# Patient Record
Sex: Female | Born: 1950 | Race: White | Hispanic: No | Smoking: Former smoker
Health system: Southern US, Community
[De-identification: ages and names within clinical notes are randomized; demographics above are authoritative.]

## PROBLEM LIST (undated history)

## (undated) DIAGNOSIS — M8580 Other specified disorders of bone density and structure, unspecified site: Secondary | ICD-10-CM

## (undated) DIAGNOSIS — D649 Anemia, unspecified: Secondary | ICD-10-CM

## (undated) DIAGNOSIS — E7800 Pure hypercholesterolemia, unspecified: Secondary | ICD-10-CM

## (undated) DIAGNOSIS — K589 Irritable bowel syndrome without diarrhea: Secondary | ICD-10-CM

## (undated) DIAGNOSIS — K5790 Diverticulosis of intestine, part unspecified, without perforation or abscess without bleeding: Secondary | ICD-10-CM

## (undated) DIAGNOSIS — J309 Allergic rhinitis, unspecified: Secondary | ICD-10-CM

## (undated) DIAGNOSIS — Z973 Presence of spectacles and contact lenses: Secondary | ICD-10-CM

## (undated) DIAGNOSIS — E559 Vitamin D deficiency, unspecified: Secondary | ICD-10-CM

## (undated) DIAGNOSIS — M1990 Unspecified osteoarthritis, unspecified site: Secondary | ICD-10-CM

## (undated) DIAGNOSIS — Z87891 Personal history of nicotine dependence: Secondary | ICD-10-CM

## (undated) DIAGNOSIS — Z9289 Personal history of other medical treatment: Secondary | ICD-10-CM

## (undated) HISTORY — PX: TOTAL KNEE ARTHROPLASTY: SHX125

## (undated) HISTORY — DX: Other specified disorders of bone density and structure, unspecified site: M85.80

## (undated) HISTORY — PX: OSTEOTOMY: SHX137

## (undated) HISTORY — DX: Presence of spectacles and contact lenses: Z97.3

## (undated) HISTORY — DX: Irritable bowel syndrome, unspecified: K58.9

## (undated) HISTORY — PX: COLONOSCOPY: SHX174

## (undated) HISTORY — DX: Diverticulosis of intestine, part unspecified, without perforation or abscess without bleeding: K57.90

## (undated) HISTORY — DX: Allergic rhinitis, unspecified: J30.9

## (undated) HISTORY — DX: Personal history of nicotine dependence: Z87.891

## (undated) HISTORY — PX: TOTAL ABDOMINAL HYSTERECTOMY W/ BILATERAL SALPINGOOPHORECTOMY: SHX83

## (undated) HISTORY — DX: Pure hypercholesterolemia, unspecified: E78.00

## (undated) HISTORY — DX: Vitamin D deficiency, unspecified: E55.9

## (undated) HISTORY — DX: Personal history of other medical treatment: Z92.89

## (undated) HISTORY — DX: Unspecified osteoarthritis, unspecified site: M19.90

## (undated) HISTORY — DX: Anemia, unspecified: D64.9

---

## 1968-03-12 HISTORY — PX: APPENDECTOMY: SHX54

## 1998-10-07 ENCOUNTER — Inpatient Hospital Stay (HOSPITAL_COMMUNITY): Admission: RE | Admit: 1998-10-07 | Discharge: 1998-10-11 | Payer: Self-pay | Admitting: Orthopedic Surgery

## 1999-03-13 HISTORY — PX: ORIF WRIST FRACTURE: SHX2133

## 1999-05-05 ENCOUNTER — Ambulatory Visit (HOSPITAL_COMMUNITY): Admission: RE | Admit: 1999-05-05 | Discharge: 1999-05-05 | Payer: Self-pay | Admitting: Gastroenterology

## 1999-08-05 ENCOUNTER — Encounter: Payer: Self-pay | Admitting: Orthopedic Surgery

## 1999-08-05 ENCOUNTER — Inpatient Hospital Stay (HOSPITAL_COMMUNITY): Admission: EM | Admit: 1999-08-05 | Discharge: 1999-08-06 | Payer: Self-pay | Admitting: *Deleted

## 1999-10-05 ENCOUNTER — Other Ambulatory Visit: Admission: RE | Admit: 1999-10-05 | Discharge: 1999-10-05 | Payer: Self-pay | Admitting: Family Medicine

## 2002-10-21 ENCOUNTER — Encounter: Payer: Self-pay | Admitting: Orthopedic Surgery

## 2002-10-26 ENCOUNTER — Inpatient Hospital Stay (HOSPITAL_COMMUNITY): Admission: RE | Admit: 2002-10-26 | Discharge: 2002-10-29 | Payer: Self-pay | Admitting: Orthopedic Surgery

## 2004-07-19 DIAGNOSIS — Z9289 Personal history of other medical treatment: Secondary | ICD-10-CM

## 2004-07-19 HISTORY — DX: Personal history of other medical treatment: Z92.89

## 2005-04-19 ENCOUNTER — Other Ambulatory Visit: Admission: RE | Admit: 2005-04-19 | Discharge: 2005-04-19 | Payer: Self-pay | Admitting: Family Medicine

## 2005-11-10 LAB — HM COLONOSCOPY

## 2006-04-24 DIAGNOSIS — Z9289 Personal history of other medical treatment: Secondary | ICD-10-CM

## 2006-04-24 HISTORY — DX: Personal history of other medical treatment: Z92.89

## 2010-04-04 ENCOUNTER — Other Ambulatory Visit: Payer: Self-pay | Admitting: Dermatology

## 2010-04-27 ENCOUNTER — Other Ambulatory Visit: Payer: Self-pay | Admitting: Dermatology

## 2010-10-11 ENCOUNTER — Ambulatory Visit (INDEPENDENT_AMBULATORY_CARE_PROVIDER_SITE_OTHER): Payer: BC Managed Care – PPO | Admitting: Family Medicine

## 2010-10-11 ENCOUNTER — Encounter: Payer: Self-pay | Admitting: Family Medicine

## 2010-10-11 VITALS — BP 132/80 | HR 64 | Ht 66.0 in | Wt 157.0 lb

## 2010-10-11 DIAGNOSIS — N959 Unspecified menopausal and perimenopausal disorder: Secondary | ICD-10-CM

## 2010-10-11 DIAGNOSIS — M545 Low back pain: Secondary | ICD-10-CM

## 2010-10-11 DIAGNOSIS — E7800 Pure hypercholesterolemia, unspecified: Secondary | ICD-10-CM

## 2010-10-11 DIAGNOSIS — D649 Anemia, unspecified: Secondary | ICD-10-CM

## 2010-10-11 DIAGNOSIS — J329 Chronic sinusitis, unspecified: Secondary | ICD-10-CM

## 2010-10-11 MED ORDER — ESTROGENS CONJUGATED 0.3 MG PO TABS: 0.3000 mg | ORAL_TABLET | Freq: Every day | ORAL | Status: DC

## 2010-10-11 MED ORDER — TRAMADOL HCL 50 MG PO TABS: 50.0000 mg | ORAL_TABLET | Freq: Four times a day (QID) | ORAL | Status: DC | PRN

## 2010-10-11 NOTE — Patient Instructions (Signed)
  Check B12 level Monday (prior to getting next weekly injection)  Consider trial of oral B12 at some point.  Zyrtec D, Mucinex , sinus rinses to help with sinus pressure  Check your insurance coverage for Zostavax (shingles vaccine)  Try tapering down (and eventually off) of your premarin--start when the weather cools down.  Continue to get your yearly mammograms

## 2010-10-11 NOTE — Progress Notes (Signed)
Patient presents to establish care.  She brings in records from her previous physician, which are reviewed in detail with her at today's visit.  She also brings in labs drawn by nurse Okey Regal (from her job) from 7/30 showing normal c-met (except Phos 4.8), total chol 194, TG 109, HDL 63, LDL 109.  Normal TSH, CBC (Hgb 13.8), Vitamin D 32.5, A1c 5.7  She reports having been evaluated back in November for episodes of significant fatigue, heaviness in her limbs.  Was found to be anemic; found to have high EBV titers--recommended to start B12 weekly injections.  B12 was 316, but anemic.  Symptoms resolved and anemia improved with weekly B12 injections. Episodes of fatigue recurred when changed B12 injections to qoweek.  Back on weekly injections, and no problems with fatigue.  Hg is now back to normal.  Today, patient is complaining of sinus pressure.  She has had sinus pressure in left maxillary sinus x months.  Has had a few sinus infections treated with z-pak, never completely resolved, but improved.  Today awoke with severe pressure in L cheek and headache behind both eyes.  Denies runny nose, sniffling, postnasal drip, throat-clearing, cough.  Took OTC sudafed with some improvement.  Denies h/o allergies. Tried Allegra-D for a month without improvement.  Has used nasal steroids in the past, but think maybe it made things worse rather than better.  She is also requesting refill on Premarin.  Has been on hormones since her hysterectomy at age 37, but dose has been tapered.  Recently has been having some mild night sweats.  Past Medical History  Diagnosis Date  . IBS (irritable bowel syndrome)   . Pure hypercholesterolemia   . Osteopenia     DEXA '09 T-1.7 at hip ([facility])  . Allergic rhinitis, cause unspecified   . Diverticulosis   . Unspecified vitamin D deficiency   . Endometriosis     s/p TAH-BSO age 40  . DJD (degenerative joint disease)     low back, hips, knees, arthritis in thumbs  . Former  smoker     quit 2006  . Anemia     low-normal B12, resolved on B12    Past Surgical History  Procedure Date  . Total abdominal hysterectomy w/ bilateral salpingoophorectomy age 55    endometriosis  . Appendectomy 1970  . Osteotomy     RLE, Dr. Thurston Hole  . Total knee arthroplasty 2000, 2004    Dr. Thurston Hole, then Dr. Sherlean Foot  . Colonoscopy 9/07    q5 years (Dr. Madilyn Fireman)  . Orif wrist fracture 2001    right    History   Social History  . Marital Status: Significant Other    Spouse Name: N/A    Number of Children: 0  . Years of Education: N/A   Occupational History  . 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 Smoking status: Former Smoker    Quit date: 03/12/2004  . Smokeless tobacco: Not on file  . Alcohol Use: Yes     1-2 month  . Drug Use: No  . Sexually Active: Not on file   Other Topics Concern  . Not on file   Social History Narrative  . No narrative on file    Family History  Problem Relation Age of Onset  . COPD Mother   . Colon polyps Mother   . Hypertension Father   . Hyperlipidemia Father   . Heart disease Father   . Cancer Paternal Grandmother  stomach    Current outpatient prescriptions:atorvastatin (LIPITOR) 40 MG tablet, Take 40 mg by mouth daily.  , Disp: , Rfl: ;  butalbital-acetaminophen-caffeine (FIORICET, ESGIC) 50-325-40 MG per tablet, Take 1 tablet by mouth as needed.  , Disp: , Rfl: ;  Calcium-Vitamin D 600-200 MG-UNIT per tablet, Take 1 tablet by mouth daily. A , Disp: , Rfl: ;  Cyanocobalamin (VITAMIN B-12 IJ), Inject 1,000 mcg as directed once a week.  , Disp: , Rfl:  dicyclomine (BENTYL) 20 MG tablet, Take 20 mg by mouth as needed.  , Disp: , Rfl: ;  estrogens, conjugated, (PREMARIN) 0.3 MG tablet, Take 1 tablet (0.3 mg total) by mouth daily., Disp: 30 tablet, Rfl: 5;  traMADol (ULTRAM) 50 MG tablet, Take 1 tablet (50 mg total) by mouth every 6 (six) hours as needed for pain., Disp: 30 tablet, Rfl: 0  Allergies    Allergen Reactions  . Morphine And Related Other (See Comments)    hallucinations  . Penicillins Other (See Comments)    Gi upset   ROS: Denies fevers, chills, +URI symptoms (see HPI). Denies urinary complaints, joint complaints, nausea, vomiting.  + joint pains (hands, low back, hips).  Denies skin rash, depression, or other concerns.  PHYSICAL EXAM:  Well developed, well nourished patient, in no distress BP 132/80  Pulse 64  Ht 5\' 6"  (1.676 m)  Wt 157 lb (71.215 kg)  BMI 25.34 kg/m2 HEENT:  Nasal mucosa mildly edematous, L>R, clear-white drainage.  Cobblestoning posterior OP.  TM's and EAc's normal.  Tender over bilateral frontal, and L>R maxillary sinuses Neck: No lymphadenopathy or thyromegaly or mass Heart:  Regular rate and rhythm, no murmurs, rubs, gallops or ectopy Lungs:  Clear bilaterally, without wheezes, rales or ronchi Abdomen:  Soft, nontender, nondistended, no hepatosplenomegaly or masses, normal bowel sounds Extremities:  No clubbing, cyanosis or edema, 2+ pulses.  Neuro:  Alert and oriented x 3, cranial nerves grossly intact.  DTR's 2+ and symmetric.  Normal strength and sensation Back:  No spine or CVA tenderness Skin: no rashes or suspicious lesions Psych:  Normal mood, affect, hygiene and grooming, normal speech, eye contact  ASSESSMENT/PLAN: 1. Sinusitis     no evidence of bacterial infection.  Supportive measures  2. Postmenopausal symptoms  estrogens, conjugated, (PREMARIN) 0.3 MG tablet   risks/benefits of hormones discussed; recommend trial to taper off once weather cools  3. Lumbago  traMADol (ULTRAM) 50 MG tablet  4. Anemia     resolved.  on weekly B12, which seems excessive for longterm.  Rec checking B12 levels again.  Consider trial of oral replacement  5. Pure hypercholesterolemia     controlled    Discussed risks versus benefits of premarin.  Already on low dose.  Given that she is having some night sweats currently, recommended further  tapering when the weather gets cooler.  Get yearly mammograms--scheduled for later this month  Discussed risks/benefits of Zostavax. She is to check with her insurance and schedule nurse visit if desired  Check B12 level Monday (prior to getting next weekly injection) Consider trial of oral B12  Sinus pain: Zyrtec D, Mucinex , sinus rinses 1-2x/day.  Reviewed signs/symptoms of bacterial infection  F/u in 6 months, sooner prn

## 2010-11-08 ENCOUNTER — Other Ambulatory Visit: Payer: Self-pay | Admitting: Family Medicine

## 2010-11-08 DIAGNOSIS — M545 Low back pain: Secondary | ICD-10-CM

## 2010-11-08 MED ORDER — TRAMADOL HCL 50 MG PO TABS: 50.0000 mg | ORAL_TABLET | Freq: Four times a day (QID) | ORAL | Status: DC | PRN

## 2010-11-08 NOTE — Telephone Encounter (Signed)
Refill request received from [facility] on [address] for Tramadol 50mg  #30.  Last filled 10/11/10.  Okay to refill now, but appears to be using daily (#30 in 30 days).  If ongoing daily use, will need OV to further discuss pain and pain control options.

## 2010-11-20 ENCOUNTER — Telehealth: Payer: Self-pay | Admitting: *Deleted

## 2010-11-20 NOTE — Telephone Encounter (Signed)
Called patient and company nurse to notify them both that Dr.Knapp is decreasing B12 injections to q2weekly and then recheck B12l level 2 months (prior to injection).

## 2010-12-22 ENCOUNTER — Telehealth: Payer: Self-pay | Admitting: Family Medicine

## 2010-12-22 DIAGNOSIS — M545 Low back pain: Secondary | ICD-10-CM

## 2010-12-22 MED ORDER — TRAMADOL HCL 50 MG PO TABS: 50.0000 mg | ORAL_TABLET | Freq: Four times a day (QID) | ORAL | Status: DC | PRN

## 2010-12-22 NOTE — Telephone Encounter (Signed)
Tramadol renewed.

## 2010-12-28 LAB — HM COLONOSCOPY

## 2011-02-26 ENCOUNTER — Other Ambulatory Visit: Payer: Self-pay | Admitting: Family Medicine

## 2011-02-26 NOTE — Telephone Encounter (Signed)
Is this ok?

## 2011-02-26 NOTE — Telephone Encounter (Signed)
This one is yours JL

## 2011-03-30 ENCOUNTER — Telehealth: Payer: Self-pay | Admitting: Internal Medicine

## 2011-03-30 MED ORDER — TRAMADOL HCL 50 MG PO TABS: 50.0000 mg | ORAL_TABLET | Freq: Four times a day (QID) | ORAL | Status: DC | PRN

## 2011-03-30 NOTE — Telephone Encounter (Signed)
E-rxd

## 2011-04-26 ENCOUNTER — Encounter: Payer: Self-pay | Admitting: *Deleted

## 2011-04-26 ENCOUNTER — Telehealth: Payer: Self-pay | Admitting: Internal Medicine

## 2011-04-26 DIAGNOSIS — N959 Unspecified menopausal and perimenopausal disorder: Secondary | ICD-10-CM

## 2011-04-26 MED ORDER — ESTROGENS CONJUGATED 0.3 MG PO TABS: 0.3000 mg | ORAL_TABLET | Freq: Every day | ORAL | Status: DC

## 2011-04-26 NOTE — Telephone Encounter (Signed)
Advise pt refilled x 3 months.  Encourage her to continue to taper down the dose/frequency of HRT due to risks we reviewed at last visit.  Per chart, mammo is UTD 10/2010, but last CPE was 05/2009.  Please schedule CPE

## 2011-04-26 NOTE — Telephone Encounter (Signed)
Left message for patient to return my call.

## 2011-04-26 NOTE — Telephone Encounter (Signed)
Spoke with patient and she scheduled CPE wPAP for 06/21/11 @ 9:15am.

## 2011-05-02 ENCOUNTER — Telehealth: Payer: Self-pay | Admitting: Internal Medicine

## 2011-05-02 MED ORDER — TRAMADOL HCL 50 MG PO TABS: 50.0000 mg | ORAL_TABLET | Freq: Four times a day (QID) | ORAL | Status: DC | PRN

## 2011-05-02 NOTE — Telephone Encounter (Signed)
Has CPE 06/2011

## 2011-05-30 ENCOUNTER — Telehealth: Payer: Self-pay | Admitting: Internal Medicine

## 2011-05-30 MED ORDER — TRAMADOL HCL 50 MG PO TABS: 50.0000 mg | ORAL_TABLET | Freq: Four times a day (QID) | ORAL | Status: DC | PRN

## 2011-05-30 NOTE — Telephone Encounter (Signed)
done

## 2011-06-21 ENCOUNTER — Encounter: Payer: Self-pay | Admitting: Family Medicine

## 2011-06-21 ENCOUNTER — Ambulatory Visit (INDEPENDENT_AMBULATORY_CARE_PROVIDER_SITE_OTHER): Payer: BC Managed Care – PPO | Admitting: Family Medicine

## 2011-06-21 VITALS — BP 130/80 | HR 72 | Ht 65.0 in | Wt 156.0 lb

## 2011-06-21 DIAGNOSIS — E538 Deficiency of other specified B group vitamins: Secondary | ICD-10-CM

## 2011-06-21 DIAGNOSIS — Z0000 Encounter for general adult medical examination without abnormal findings: Secondary | ICD-10-CM

## 2011-06-21 DIAGNOSIS — Z23 Encounter for immunization: Secondary | ICD-10-CM

## 2011-06-21 DIAGNOSIS — Z2911 Encounter for prophylactic immunotherapy for respiratory syncytial virus (RSV): Secondary | ICD-10-CM

## 2011-06-21 DIAGNOSIS — E7800 Pure hypercholesterolemia, unspecified: Secondary | ICD-10-CM

## 2011-06-21 DIAGNOSIS — M8580 Other specified disorders of bone density and structure, unspecified site: Secondary | ICD-10-CM | POA: Insufficient documentation

## 2011-06-21 DIAGNOSIS — N959 Unspecified menopausal and perimenopausal disorder: Secondary | ICD-10-CM

## 2011-06-21 DIAGNOSIS — M899 Disorder of bone, unspecified: Secondary | ICD-10-CM

## 2011-06-21 LAB — POCT URINALYSIS DIPSTICK
Blood, UA: NEGATIVE
Ketones, UA: NEGATIVE
Protein, UA: NEGATIVE
Spec Grav, UA: <=1.005

## 2011-06-21 MED ORDER — ESTROGENS CONJUGATED 0.3 MG PO TABS: 0.3000 mg | ORAL_TABLET | Freq: Every day | ORAL | Status: DC

## 2011-06-21 NOTE — Progress Notes (Signed)
Carol Murphy is a 61 y.o. female who presents for a complete physical.  She has the following concerns:  Wants to discuss mood swings and hot flashes. Has been tapering the premarin, but having a lot more hot flashes.  Noted this when she went to every other day.  Has felt somewhat down, denies irritability.  Chronic issues with sleeping, doesn't seem to be worse, because she takes nightly sleep meds.  Has sinus pressure this time of year, uses sudafed and sinus rinses with good results. Gets insomnia if takes sudafed too late.  Her insurance covers shingles vaccine, and she would like this today, although has some questions.  Hyperlipidemia--compliant with meds. She brings in copies of labs done at work 06/15/11: c-met normal, with normal iron, uric acid. Glu 95 Total chol 162, TG 71, HDL 60, LDL 88, chol/HDL ratio 2.7 Normal thyroid studies. Normal CBC, MCV 94. A1c 5.7% Vitamin D-OH is 28.1 (on 1000 IU of Vitamin D currently in her vitamins)  Health Maintenance Immunization History  Administered Date(s) Administered  . Influenza Split 12/31/1996, 01/19/2000, 01/30/2001  . Influenza Whole 01/29/2002, 01/11/2007  . Td 11/04/1991, 10/29/2000  . Tdap 05/11/2009  . Zoster 06/21/2011   Last Pap smear: n/a Last mammogram: 10/2010 Last colonoscopy: 9/12 with Dr. Madilyn Fireman --reportedly normal (not sent results) Last DEXA: 2/09 Dentist: twice yearly Ophtho: yearly--scheduled for this month Exercise: walks daily.  No weight-bearing exercise  Past Medical History  Diagnosis Date  . IBS (irritable bowel syndrome)   . Pure hypercholesterolemia   . Osteopenia     DEXA '09 T-1.7 at hip ([facility])  . Allergic rhinitis, cause unspecified   . Diverticulosis   . Unspecified vitamin D deficiency   . Endometriosis     s/p TAH-BSO age 59  . DJD (degenerative joint disease)     low back, hips, knees, arthritis in thumbs  . Former smoker     quit 2006  . Anemia     low-normal B12, resolved on B12     Past Surgical History  Procedure Date  . Total abdominal hysterectomy w/ bilateral salpingoophorectomy age 45    endometriosis  . Appendectomy 1970  . Osteotomy     RLE, Dr. Thurston Hole  . Total knee arthroplasty 2000, 2004    Dr. Thurston Hole, then Dr. Sherlean Foot  . Colonoscopy 9/07, 11/2010    q5 years (Dr. Madilyn Fireman)  . Orif wrist fracture 2001    right    History   Social History  . Marital Status: Significant Other    Spouse Name: N/A    Number of Children: 0  . Years of Education: N/A   Occupational History  . 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 Smoking status: Former Smoker    Quit date: 03/12/2004  . Smokeless tobacco: Never Used  . Alcohol Use: Yes     1-2 month  . Drug Use: No  . Sexually Active: Not on file   Other Topics Concern  . Not on file   Social History Narrative   Lives with [name], her female partner, and her 58 year old daughter, and 2 chihuahuas.    Family History  Problem Relation Age of Onset  . COPD Mother   . Colon polyps Mother   . Hypertension Father   . Hyperlipidemia Father   . Heart disease Father   . Diabetes Father     diet controlled  . Cancer Paternal Grandmother     stomach  Current Outpatient Prescriptions on File Prior to Visit  Medication Sig Dispense Refill  . atorvastatin (LIPITOR) 40 MG tablet Take 40 mg by mouth daily.        . butalbital-acetaminophen-caffeine (FIORICET, ESGIC) 50-325-40 MG per tablet Take 1 tablet by mouth as needed.        . Calcium-Vitamin D 600-200 MG-UNIT per tablet Take 1 tablet by mouth daily. A       . Cyanocobalamin (VITAMIN B-12 IJ) Inject 1,000 mcg as directed every 14 (fourteen) days.       Marland Kitchen dicyclomine (BENTYL) 20 MG tablet Take 20 mg by mouth as needed.        . diphenhydramine-acetaminophen (TYLENOL PM) 25-500 MG TABS Take 1 tablet by mouth at bedtime as needed.      . traMADol (ULTRAM) 50 MG tablet Take 1 tablet (50 mg total) by mouth every 6 (six) hours  as needed for pain.  30 tablet  0  . DISCONTD: estrogens, conjugated, (PREMARIN) 0.3 MG tablet Take 1 tablet (0.3 mg total) by mouth daily.  30 tablet  2   Allergies  Allergen Reactions  . Morphine And Related Other (See Comments)    hallucinations  . Penicillins Other (See Comments)    Gi upset   ROS:  The patient denies anorexia, fever, weight changes, headaches,  vision changes, decreased hearing, ear pain, sore throat, breast concerns, chest pain, palpitations, dizziness, syncope, dyspnea on exertion, cough, swelling, nausea, vomiting, diarrhea, constipation, abdominal pain, melena, hematochezia, indigestion/heartburn, hematuria, dysuria, vaginal bleeding, discharge, odor or itch, genital lesions, numbness, tingling, weakness, tremor, suspicious skin lesions, anxiety, abnormal bleeding/bruising, or enlarged lymph nodes. +headaches 2-3 times/month, relieved by her headache meds. +sinus headaches Occasional stress incontinence  PHYSICAL EXAM: BP 130/80  Pulse 72  Ht 5\' 5"  (1.651 m)  Wt 156 lb (70.761 kg)  BMI 25.96 kg/m2  General Appearance:    Alert, cooperative, no distress, appears stated age  Head:    Normocephalic, without obvious abnormality, atraumatic  Eyes:    PERRL, conjunctiva/corneas clear, EOM's intact, fundi    benign  Ears:    Normal TM's and external ear canals  Nose:   Nares normal, mucosa mildly edematous, no drainage or sinus   tenderness  Throat:   Lips, mucosa, and tongue normal; teeth and gums normal  Neck:   Supple, no lymphadenopathy;  thyroid:  no   enlargement/tenderness/nodules; no carotid   bruit or JVD  Back:    Spine nontender, no curvature, ROM normal, no CVA  tenderness.  Tenderness at L SI joint.  No muscle spasm.  Lungs:     Clear to auscultation bilaterally without wheezes, rales or     ronchi; respirations unlabored  Chest Wall:    No tenderness or deformity   Heart:    Regular rate and rhythm, S1 and S2 normal, no murmur, rub   or gallop   Breast Exam:    No tenderness, masses, or nipple discharge or inversion.      No axillary lymphadenopathy  Abdomen:     Soft, non-tender, nondistended, normoactive bowel sounds,    no masses, no hepatosplenomegaly  Genitalia:    Normal external genitalia without lesions.  BUS and vagina normal; normal bimanual exam without masses.  Pap not performed.  Rectal:    Normal tone, no masses or tenderness; no stool in vault for heme-testing.  Extremities:   No clubbing, cyanosis or edema  Pulses:   2+ and symmetric all extremities  Skin:  Skin color, texture, turgor normal, no rashes or lesions  Lymph nodes:   Cervical, supraclavicular, and axillary nodes normal  Neurologic:   CNII-XII intact, normal strength, sensation and gait; reflexes 2+ and symmetric throughout          Psych:   Normal mood, affect, hygiene and grooming.     ASSESSMENT/PLAN: 1. Routine general medical examination at a health care facility  POCT Urinalysis Dipstick, Visual acuity screening  2. Postmenopausal symptoms  estrogens, conjugated, (PREMARIN) 0.3 MG tablet  3. Pure hypercholesterolemia     well controlled on Lipitor  4. Need for shingles vaccine  Varicella-zoster vaccine subcutaneous  5. Osteopenia     due for DEXA (at [facility])  6. B12 deficiency     getting shots every 2 weeks.  due for repeat level (gets labs drawn at work)    Vitamin D deficiency--increase to 2000 IU daily Menopausal symptoms--risks vs benefits of HRT reviewed.  Given lack of family history of breast cancer, and that she is not tolerating further taper down, and is on a low dose, elects to continue the 0.3 mg of premarin.  Continue to get yearly mammograms, self breast exams.  If there should be an abnormality on mammogram, understands this will need to be stopped until it is fully evaluated, and may need to consider other medications (ie Tamoxifen) to treat symptoms in that scenario.  Hyperlipidemia--well controlled. Gets lipitor free from her  job. Well controlled. B12 deficiency--cut back from weekly shots to every other week in August 2012 after levels were >1000.  Hasn't had level checked since.  Recommend checking it next week prior to getting next B12 injection (will get it done at work)  Discussed monthly self breast exams and yearly mammograms after the age of 30; at least 30 minutes of aerobic activity at least 5 days/week; proper sunscreen use reviewed; healthy diet, including goals of calcium and vitamin D intake and alcohol recommendations (less than or equal to 1 drink/day) reviewed; regular seatbelt use; changing batteries in smoke detectors.  Immunization recommendations discussed--zostavax given today.  Colonoscopy recommendations reviewed--UTD.  zostavax--risks and side effects reviewed.  Osteopenia: Weight-bearing exercise encouraged.  Rx given to patient as order for DEXA at [facility].  She will let us know if she has trouble scheduling it this way

## 2011-06-21 NOTE — Patient Instructions (Addendum)
HEALTH MAINTENANCE RECOMMENDATIONS:  It is recommended that you get at least 30 minutes of aerobic exercise at least 5 days/week (for weight loss, you may need as much as 60-90 minutes). This can be any activity that gets your heart rate up. This can be divided in 10-15 minute intervals if needed, but try and build up your endurance at least once a week.  Weight bearing exercise is also recommended twice weekly.  Eat a healthy diet with lots of vegetables, fruits and fiber.  "Colorful" foods have a lot of vitamins (ie green vegetables, tomatoes, red peppers, etc).  Limit sweet tea, regular sodas and alcoholic beverages, all of which has a lot of calories and sugar.  Up to 1 alcoholic drink daily may be beneficial for women (unless trying to lose weight, watch sugars).  Drink a lot of water.  Calcium recommendations are 1200-1500 mg daily (1500 mg for postmenopausal women or women without ovaries), and vitamin D 1000 IU daily.  This should be obtained from diet and/or supplements (vitamins), and calcium should not be taken all at once, but in divided doses.  Monthly self breast exams and yearly mammograms for women over the age of 15 is recommended.  Sunscreen of at least SPF 30 should be used on all sun-exposed parts of the skin when outside between the hours of 10 am and 4 pm (not just when at beach or pool, but even with exercise, golf, tennis, and yard work!)  Use a sunscreen that says "broad spectrum" so it covers both UVA and UVB rays, and make sure to reapply every 1-2 hours.  Remember to change the batteries in your smoke detectors when changing your clock times in the spring and fall.  Use your seat belt every time you are in a car, and please drive safely and not be distracted with cell phones and texting while driving.   Try some claritin or zyrtec to see if that helps with your seasonal sinus pressure. Continue sinus rinses, and sudafed as needed.  Add weight-bearing exercise to your  routine once or twice weekly.

## 2011-06-28 ENCOUNTER — Telehealth: Payer: Self-pay | Admitting: Internal Medicine

## 2011-06-28 MED ORDER — TRAMADOL HCL 50 MG PO TABS: 50.0000 mg | ORAL_TABLET | Freq: Four times a day (QID) | ORAL | Status: DC | PRN

## 2011-06-28 NOTE — Telephone Encounter (Signed)
done

## 2011-07-02 ENCOUNTER — Encounter: Payer: Self-pay | Admitting: Family Medicine

## 2011-09-03 ENCOUNTER — Telehealth: Payer: Self-pay | Admitting: Internal Medicine

## 2011-09-03 MED ORDER — TRAMADOL HCL 50 MG PO TABS: 50.0000 mg | ORAL_TABLET | Freq: Four times a day (QID) | ORAL | Status: DC | PRN

## 2011-09-03 NOTE — Telephone Encounter (Signed)
Refill history reviewed--filled 2/20, 3/20. 4/18, 5/20.  It appears that she is taking this daily.  Previously reported taking only "every other day or so".  Recommend OV to discuss other possible treatments for back pain (or whatever pain she is treating with it).  Okay for #30

## 2011-09-03 NOTE — Telephone Encounter (Signed)
Spoke with patient and informed her that Dr.Knapp would like to her schedule OV to discuss tx options. Called in #30 and patient said that she would have to call back to set up appt as she had to check her schedule.

## 2011-12-12 ENCOUNTER — Telehealth: Payer: Self-pay | Admitting: Internal Medicine

## 2011-12-12 DIAGNOSIS — N959 Unspecified menopausal and perimenopausal disorder: Secondary | ICD-10-CM

## 2011-12-12 MED ORDER — ESTROGENS CONJUGATED 0.3 MG PO TABS: 0.3000 mg | ORAL_TABLET | Freq: Every day | ORAL | Status: DC

## 2011-12-12 NOTE — Telephone Encounter (Signed)
Spoke with patient and let her know that refill was called in until April, when she is due for CPE. Mammo done 11/04/11 @ [facility]-report in chart and signed off on.

## 2011-12-12 NOTE — Telephone Encounter (Signed)
Need to see if she had mammo (was due in August)--no scanned records or imaging results that I can see in computer. If she has had mammogram, then can refill until April (when CPE due)

## 2012-04-04 ENCOUNTER — Encounter: Payer: Self-pay | Admitting: Internal Medicine

## 2012-04-04 DIAGNOSIS — Z9289 Personal history of other medical treatment: Secondary | ICD-10-CM

## 2012-04-04 HISTORY — DX: Personal history of other medical treatment: Z92.89

## 2012-06-18 ENCOUNTER — Encounter: Payer: Self-pay | Admitting: Medical

## 2012-06-18 ENCOUNTER — Ambulatory Visit (INDEPENDENT_AMBULATORY_CARE_PROVIDER_SITE_OTHER): Payer: PRIVATE HEALTH INSURANCE | Admitting: Medical

## 2012-06-18 ENCOUNTER — Telehealth: Payer: Self-pay | Admitting: Family Medicine

## 2012-06-18 VITALS — BP 100/80 | HR 74 | Temp 98.2°F | Resp 16 | Ht 64.5 in | Wt 149.0 lb

## 2012-06-18 DIAGNOSIS — Z0000 Encounter for general adult medical examination without abnormal findings: Secondary | ICD-10-CM

## 2012-06-18 DIAGNOSIS — Z780 Asymptomatic menopausal state: Secondary | ICD-10-CM

## 2012-06-18 DIAGNOSIS — R0989 Other specified symptoms and signs involving the circulatory and respiratory systems: Secondary | ICD-10-CM

## 2012-06-18 DIAGNOSIS — M949 Disorder of cartilage, unspecified: Secondary | ICD-10-CM

## 2012-06-18 DIAGNOSIS — J309 Allergic rhinitis, unspecified: Secondary | ICD-10-CM

## 2012-06-18 DIAGNOSIS — E538 Deficiency of other specified B group vitamins: Secondary | ICD-10-CM

## 2012-06-18 DIAGNOSIS — N959 Unspecified menopausal and perimenopausal disorder: Secondary | ICD-10-CM

## 2012-06-18 DIAGNOSIS — E7800 Pure hypercholesterolemia, unspecified: Secondary | ICD-10-CM

## 2012-06-18 DIAGNOSIS — M8580 Other specified disorders of bone density and structure, unspecified site: Secondary | ICD-10-CM

## 2012-06-18 LAB — POCT URINALYSIS DIPSTICK
Bilirubin, UA: NEGATIVE
Glucose, UA: NEGATIVE
Leukocytes, UA: NEGATIVE
Nitrite, UA: NEGATIVE
Urobilinogen, UA: NEGATIVE
pH, UA: 7

## 2012-06-18 MED ORDER — ESTROGENS CONJUGATED 0.3 MG PO TABS: 0.3000 mg | ORAL_TABLET | Freq: Every day | ORAL | Status: DC

## 2012-06-18 MED ORDER — FLUTICASONE PROPIONATE 50 MCG/ACT NA SUSP: 2.0000 | Freq: Every day | NASAL | Status: DC

## 2012-06-18 NOTE — Telephone Encounter (Signed)
Message copied by Beverly Suriano L on Wed Jun 18, 2012  3:11 PM ------      Message from: Aleen Campi, DAVID S      Created: Wed Jun 18, 2012  9:54 AM       pls set up ABIs with [facility] due to decreased LE pulses and mild claudication symptoms, but I want to speak to office manager first. ------

## 2012-06-18 NOTE — Progress Notes (Signed)
Subjective:   HPI  Carol Murphy is a 62 y.o. female who presents for a complete physical.  Doing well, no c/o.   just had bone density screening and mammogram done, labs through work.   Has copies of all of this today.   Preventative care: Last ophthalmology visit:yes-unsure of the name Last dental visit:yes- Dr. Valetta Fuller Last colonoscopy:2013- Dr. Madilyn Fireman Last mammogram:04/04/2012 Last gynecological exam:don't have these Last ZOX:WRUEAV  Last labs:06/12/12  Prior vaccinations: TD or Tdap:05/11/09 Influenza:01/2012 Pneumococcal:never Shingles/Zostavax:06/2011  Advanced directive:yes Health care power of attorney:yes Living will:yes  Concerns: Chronic sinus pressure, uses Claritin and sudafed daily.  No typical allergy symptoms, just ongoing sinus pressure.  Reviewed their medical, surgical, family, social, medication, and allergy history and updated chart as appropriate.   Past Medical History  Diagnosis Date  . IBS (irritable bowel syndrome)   . Pure hypercholesterolemia   . Osteopenia     DEXA '09 T-1.7 at hip ([facility])  . Allergic rhinitis, cause unspecified   . Diverticulosis   . Unspecified vitamin D deficiency   . Endometriosis     s/p TAH-BSO age 80  . DJD (degenerative joint disease)     low back, hips, knees, arthritis in thumbs  . Former smoker     quit 2006  . Anemia     low-normal B12, resolved on B12  . History of cardiovascular stress test 07/19/04    age 6yo, normal cardiolite study, Dr. Jacinto Halim  . H/O echocardiogram 04/24/2006    normal study, EF>55%, mild tricuspid regurgitation; Dr. Jacinto Halim  . H/O bone density study 04/04/12, 2009    low bone mass, improved from 2009 study, consider repeat 3 years  . H/O mammogram 04/04/12    normal  . Wears glasses     Past Surgical History  Procedure Laterality Date  . Total abdominal hysterectomy w/ bilateral salpingoophorectomy  age 63    endometriosis  . Appendectomy  1970  . Osteotomy      RLE, Dr. Thurston Hole   . Total knee arthroplasty  2000, 2004    Dr. Thurston Hole, then Dr. Sherlean Foot  . Colonoscopy  9/07, 11/2011    q5 years (Dr. Madilyn Fireman)  . Orif wrist fracture  2001    right    Family History  Problem Relation Age of Onset  . COPD Mother   . Colon polyps Mother   . Hypertension Father   . Hyperlipidemia Father   . Heart disease Father 50    died of MI  . Diabetes Father     diet controlled  . Cancer Paternal Grandmother     stomach  . Heart disease Paternal Aunt   . Heart disease Paternal Uncle   . Stroke Neg Hx     History   Social History  . Marital Status: Significant Other    Spouse Name: N/A    Number of Children: 0  . Years of Education: N/A   Occupational History  . 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 Smoking status: Former Smoker -- 1.00 packs/day for 25 years    Quit date: 03/12/2004  . Smokeless tobacco: Never Used  . Alcohol Use: Yes     Comment: 1-2 month  . Drug Use: No  . Sexually Active: Not on file   Other Topics Concern  . Not on file   Social History Narrative   Lives with [name], her female partner, and her 39 year old daughter, and 2 chihuahuas.  Exercise -  walks most days;  Works at [employer].    Current Outpatient Prescriptions on File Prior to Visit  Medication Sig Dispense Refill  . atorvastatin (LIPITOR) 40 MG tablet Take 40 mg by mouth daily.        . butalbital-acetaminophen-caffeine (FIORICET, ESGIC) 50-325-40 MG per tablet Take 1 tablet by mouth as needed.        . Calcium-Vitamin D 600-200 MG-UNIT per tablet Take 1 tablet by mouth daily. A       . ciprofloxacin (CIPRO) 500 MG tablet Take 500 mg by mouth as needed.      . Cyanocobalamin (VITAMIN B-12 IJ) Inject 1,000 mcg as directed every 21 ( twenty-one) days.       Marland Kitchen dicyclomine (BENTYL) 20 MG tablet Take 20 mg by mouth as needed.       . diphenhydramine-acetaminophen (TYLENOL PM) 25-500 MG TABS Take 1 tablet by mouth at bedtime as needed.       . pseudoephedrine (SUDAFED) 30 MG tablet Take 30 mg by mouth every 4 (four) hours as needed.      . traMADol (ULTRAM) 50 MG tablet Take 1 tablet (50 mg total) by mouth every 6 (six) hours as needed for pain.  30 tablet  0   No current facility-administered medications on file prior to visit.    Allergies  Allergen Reactions  . Morphine And Related Other (See Comments)    hallucinations  . Penicillins Other (See Comments)    Gi upset    Review of Systems Constitutional: -fever, -chills, -sweats, -unexpected weight change, -decreased appetite, -fatigue Allergy: -sneezing, -itching, -congestion Dermatology: -changing moles, --rash, -lumps ENT: -runny nose, -ear pain, -sore throat, -hoarseness, +sinus pain, -teeth pain, - ringing in ears, -hearing loss, -nosebleeds Cardiology: -chest pain, -palpitations, -swelling, -difficulty breathing when lying flat, -waking up short of breath Respiratory: -cough, -shortness of breath, -difficulty breathing with exercise or exertion, -wheezing, -coughing up blood Gastroenterology: -abdominal pain, -nausea, -vomiting, -diarrhea, -constipation, -blood in stool, -changes in bowel movement, -difficulty swallowing or eating Hematology: -bleeding, -bruising  Musculoskeletal: +joint aches, -muscle aches, -joint swelling, +back pain, -neck pain, -cramping, -changes in gait Ophthalmology: denies vision changes, eye redness, itching, discharge Urology: -burning with urination, -difficulty urinating, -blood in urine, -urinary frequency, -urgency, -incontinence Neurology: +headache, -weakness, -tingling, -numbness, -memory loss, -falls, -dizziness Psychology: -depressed mood, -agitation, +sleep problems     Objective:   Physical Exam  Vitals and nurse notes reviewed   General appearance: alert, no distress, WD/WN, white female Skin: scattered benign appearing lesions, no worrisome lesions HEENT: normocephalic, conjunctiva/corneas normal, sclerae anicteric,  PERRLA, EOMi, nares patent, no discharge or erythema, pharynx normal Oral cavity: MMM, tongue normal, teeth in good repair Neck: supple, no lymphadenopathy, no thyromegaly, no masses, normal ROM, no bruits Chest: non tender, normal shape and expansion Heart: RRR, normal S1, S2, no murmurs Lungs: CTA bilaterally, no wheezes, rhonchi, or rales Abdomen: +bs, soft, non tender, non distended, no masses, no hepatomegaly, no splenomegaly, no bruits Back: non tender, normal ROM, no scoliosis Musculoskeletal: upper extremities non tender, no obvious deformity, normal ROM throughout, lower extremities non tender, no obvious deformity, normal ROM throughout Extremities: no edema, no cyanosis, no clubbing Pulses: 1+ pedal pulses, somewhat difficult to palpate, otherwise 2+ symmetric, upper extremity pulses, normal cap refill Neurological: alert, oriented x 3, CN2-12 intact, strength normal upper extremities and lower extremities, sensation normal throughout, DTRs 2+ throughout, no cerebellar signs, gait normal Psychiatric: normal affect, behavior normal, pleasant  Breast: nontender, no masses or  lumps, no skin changes, no nipple discharge or inversion, no axillary lymphadenopathy Gyn: introitus and labial atrophic changes noted,mild, othewrise normal external genitalia without lesions, vagina with normal mucosa, s/p hysterectomy, no abnormal vaginal discharge, nontender, no masses.  Pap not performed.  Exam chaperoned by nurse. Rectal:deferred     Assessment and Plan :    Encounter Diagnoses  Name Primary?  . Routine general medical examination at a health care facility Yes  . Decreased pulses in feet   . Allergic rhinitis   . Postmenopausal symptoms   . Pure hypercholesterolemia   . Postmenopausal   . Vitamin B12 deficiency   . Osteopenia     Physical exam - discussed healthy lifestyle, diet, exercise, preventative care, vaccinations, and addressed their concerns.  Handout given.  reviewed  recent studies, labs.  Decreased pulses, mild claudication symptoms - will set up ABIs for screening  Allergic rhinitis - stop Claritin, limit sudafed, begin Zyrtec 10mg  QHS and fluticasone nasal, recheck 53mo  Postmenopausal - discussed risks/benefits of premarin.   She is aware of risks, but is quite symptomatic with hot flashes and insomnia without, wants to c/t Premarin low dose.   Refills given  Hypercholesterolemia - reviewed recent labs, c/t same medication  B12 deficiency - c/t B12 shots q3 wk  Osteopenia - reviewed bone density study - c/t aerobic exercise, add weight bearing exercise weekly, Ca 1500mg  with 1000-2000 IU daily Vit D, limit alcohol  Follow-up 53mo on allergies/sinus pressure

## 2012-06-18 NOTE — Telephone Encounter (Signed)
I fax everything over to [facility] for her referral to have ABI'S done and they will contact the patient to schedule her appointment. CLS

## 2012-06-18 NOTE — Patient Instructions (Signed)
Recommendations: Continue 1500mg  calcium daily + 1000-2000 IU Vit D daily  Allergies/sinuses - Begin Zyrtec 10mg  at bedtime instead of Claritin Try backing off the Sudafed Begin Flonase nasal spray, 2 sprays per nostril daily (sniff sniff)  Get regular aerobic and weight bearing exercise  We will set you up for ABI blood pressure studies in the leg to rule out peripheral vascular disease  Continue you other medications as usual.

## 2012-06-19 ENCOUNTER — Other Ambulatory Visit (HOSPITAL_COMMUNITY): Payer: Self-pay | Admitting: Medical

## 2012-06-19 ENCOUNTER — Encounter: Payer: Self-pay | Admitting: Medical

## 2012-06-19 DIAGNOSIS — R0989 Other specified symptoms and signs involving the circulatory and respiratory systems: Secondary | ICD-10-CM

## 2012-06-19 DIAGNOSIS — I70219 Atherosclerosis of native arteries of extremities with intermittent claudication, unspecified extremity: Secondary | ICD-10-CM

## 2012-06-24 ENCOUNTER — Ambulatory Visit (HOSPITAL_COMMUNITY)
Admission: RE | Admit: 2012-06-24 | Discharge: 2012-06-24 | Disposition: A | Discharge status: Home / Self Care | Payer: PRIVATE HEALTH INSURANCE | Source: Ambulatory Visit | Attending: Cardiovascular Disease | Admitting: Cardiovascular Disease

## 2012-06-24 DIAGNOSIS — I70219 Atherosclerosis of native arteries of extremities with intermittent claudication, unspecified extremity: Secondary | ICD-10-CM | POA: Insufficient documentation

## 2012-06-24 DIAGNOSIS — R0989 Other specified symptoms and signs involving the circulatory and respiratory systems: Secondary | ICD-10-CM | POA: Insufficient documentation

## 2012-06-24 NOTE — Progress Notes (Signed)
ABIs were completed. Larene Pickett RVT

## 2012-07-15 ENCOUNTER — Ambulatory Visit: Payer: PRIVATE HEALTH INSURANCE | Admitting: Medical

## 2012-09-17 ENCOUNTER — Ambulatory Visit: Payer: PRIVATE HEALTH INSURANCE | Admitting: Medical

## 2012-12-29 ENCOUNTER — Encounter: Payer: Self-pay | Admitting: Medical

## 2012-12-29 ENCOUNTER — Ambulatory Visit (INDEPENDENT_AMBULATORY_CARE_PROVIDER_SITE_OTHER): Payer: PRIVATE HEALTH INSURANCE | Admitting: Medical

## 2012-12-29 ENCOUNTER — Telehealth: Payer: Self-pay | Admitting: Family Medicine

## 2012-12-29 VITALS — BP 130/88 | HR 80 | Temp 98.1°F | Resp 16 | Wt 149.0 lb

## 2012-12-29 DIAGNOSIS — R300 Dysuria: Secondary | ICD-10-CM

## 2012-12-29 DIAGNOSIS — K5732 Diverticulitis of large intestine without perforation or abscess without bleeding: Secondary | ICD-10-CM

## 2012-12-29 DIAGNOSIS — R1032 Left lower quadrant pain: Secondary | ICD-10-CM

## 2012-12-29 DIAGNOSIS — R319 Hematuria, unspecified: Secondary | ICD-10-CM

## 2012-12-29 DIAGNOSIS — K5792 Diverticulitis of intestine, part unspecified, without perforation or abscess without bleeding: Secondary | ICD-10-CM

## 2012-12-29 LAB — CBC WITH DIFFERENTIAL/PLATELET
Basophils Absolute: 0 10*3/uL (ref 0.0–0.1)
Basophils Relative: 0 % (ref 0–1)
Eosinophils Absolute: 0.1 10*3/uL (ref 0.0–0.7)
Eosinophils Relative: 0 % (ref 0–5)
Lymphs Abs: 1.5 10*3/uL (ref 0.7–4.0)
MCH: 31.7 pg (ref 26.0–34.0)
MCHC: 34.9 g/dL (ref 30.0–36.0)
MCV: 90.6 fL (ref 78.0–100.0)
Neutrophils Relative %: 84 % — ABNORMAL HIGH (ref 43–77)
Platelets: 247 10*3/uL (ref 150–400)
RBC: 4.8 MIL/uL (ref 3.87–5.11)
RDW: 12.7 % (ref 11.5–15.5)

## 2012-12-29 LAB — POCT URINALYSIS DIPSTICK
Glucose, UA: NEGATIVE
Nitrite, UA: NEGATIVE
Spec Grav, UA: 1.01
Urobilinogen, UA: NEGATIVE

## 2012-12-29 MED ORDER — HYDROCODONE-ACETAMINOPHEN 5-325 MG PO TABS: 1.0000 | ORAL_TABLET | Freq: Four times a day (QID) | ORAL | Status: DC | PRN

## 2012-12-29 MED ORDER — METRONIDAZOLE 500 MG PO TABS: 500.0000 mg | ORAL_TABLET | Freq: Three times a day (TID) | ORAL | Status: DC

## 2012-12-29 MED ORDER — CIPROFLOXACIN HCL 750 MG PO TABS: 750.0000 mg | ORAL_TABLET | Freq: Two times a day (BID) | ORAL | Status: DC

## 2012-12-29 NOTE — Telephone Encounter (Signed)
Message copied by Janeice Robinson on Mon Dec 29, 2012  3:55 PM ------      Message from: Jac Canavan      Created: Mon Dec 29, 2012  1:34 PM       pls get copy of 2013 colonoscopy, Dr. Madilyn Fireman ------

## 2012-12-29 NOTE — Progress Notes (Signed)
Subjective: Here for severe left abdominal pain, diarrhea, started last night.  Been up most of night with pain and diarrhea, x 6 loose stools.  Reports a history of diverticulosis on colonoscopy, diverticulitis mild flareup about 9 months ago, was treated with Cipro and Flagyl and did okay on this.  She thinks this is currently what is going on.  Currently she still has left lower quadrant abdominal pain, left back pain, and now more midline pelvic pain and pressure.  She reports urinary urgency.  She has seen little blood in her stool, and saw this last time she had diverticulitis.   Last colonoscopy last year 2013.  She denies fever, nausea, vomiting, vaginal discharge or vaginal complaint, no history of UTI or kidney stone.    Past Medical History  Diagnosis Date  . IBS (irritable bowel syndrome)   . Pure hypercholesterolemia   . Osteopenia     DEXA '09 T-1.7 at hip ([facility])  . Allergic rhinitis, cause unspecified   . Diverticulosis   . Unspecified vitamin D deficiency   . Endometriosis     s/p TAH-BSO age 43  . DJD (degenerative joint disease)     low back, hips, knees, arthritis in thumbs  . Former smoker     quit 2006  . Anemia     low-normal B12, resolved on B12  . History of cardiovascular stress test 07/19/04    age 82yo, normal cardiolite study, Dr. Jacinto Halim  . H/O echocardiogram 04/24/2006    normal study, EF>55%, mild tricuspid regurgitation; Dr. Jacinto Halim  . H/O bone density study 04/04/12, 2009    low bone mass, improved from 2009 study, consider repeat 3 years  . H/O mammogram 04/04/12    normal  . Wears glasses    Past Surgical History  Procedure Laterality Date  . Total abdominal hysterectomy w/ bilateral salpingoophorectomy  age 66    endometriosis  . Appendectomy  1970  . Osteotomy      RLE, Dr. Thurston Hole  . Total knee arthroplasty  2000, 2004    Dr. Thurston Hole, then Dr. Sherlean Foot  . Colonoscopy  9/07, 11/2011    q5 years (Dr. Madilyn Fireman)  . Orif wrist fracture  2001    right    ROS  as in subjective  Objective: Filed Vitals:   12/29/12 1153  BP: 130/88  Pulse: 80  Temp: 98.1 F (36.7 C)  Resp: 16    General appearance: alert, no distress, WD/WN Heart: RRR, normal S1, S2, no murmurs Lungs: CTA bilaterally, no wheezes, rhonchi, or rales Abdomen: +decreased bs, soft, quite tender left lower quadrant, tenderness suprapubic, mild left sided generalized tenderness , non distended, no masses, no hepatomegaly, no splenomegaly Back: mild CVA tenderness left Pulses: 2+ symmetric  Assessment: Encounter Diagnoses  Name Primary?  . Abdominal pain, left lower quadrant Yes  . Diverticulitis   . Blood in urine   . Dysuria     Plan: Discussed her symptoms and exam findings. Her urinalysis shows moderate blood and trace protein.   We will check some labs as send the urine for culture. We will empirically treat for diverticulitis with Flagyl and Cipro. Advise clear liquids only intake, bowel rest, and as things improve can add back bland B.R.A.T. Diet.  Discussed the diagnosis and answered her questions. Handout given. We will also try and get a copy of her 2013 colonoscopy. Follow-up pending labs and f/u in 1wk, sooner prn.

## 2012-12-29 NOTE — Telephone Encounter (Signed)
Dr. Madilyn Fireman office will fax over her 2012 colonoscopy report. CLS

## 2012-12-29 NOTE — Patient Instructions (Signed)
Diverticulitis °A diverticulum is a small pouch or sac on the colon. Diverticulosis is the presence of these diverticula on the colon. Diverticulitis is the irritation (inflammation) or infection of diverticula. °CAUSES  °The colon and its diverticula contain bacteria. If food particles block the tiny opening to a diverticulum, the bacteria inside can grow and cause an increase in pressure. This leads to infection and inflammation and is called diverticulitis. °SYMPTOMS  °· Abdominal pain and tenderness. Usually, the pain is located on the left side of your abdomen. However, it could be located elsewhere. °· Fever. °· Bloating. °· Feeling sick to your stomach (nausea). °· Throwing up (vomiting). °· Abnormal stools. °DIAGNOSIS  °Your caregiver will take a history and perform a physical exam. Since many things can cause abdominal pain, other tests may be necessary. Tests may include: °· Blood tests. °· Urine tests. °· X-ray of the abdomen. °· CT scan of the abdomen. °Sometimes, surgery is needed to determine if diverticulitis or other conditions are causing your symptoms. °TREATMENT  °Most of the time, you can be treated without surgery. Treatment includes: °· Resting the bowels by only having liquids for a few days. As you improve, you will need to eat a low-fiber diet. °· Intravenous (IV) fluids if you are losing body fluids (dehydrated). °· Antibiotic medicines that treat infections may be given. °· Pain and nausea medicine, if needed. °· Surgery if the inflamed diverticulum has burst. °HOME CARE INSTRUCTIONS  °· Try a clear liquid diet (broth, tea, or water for as long as directed by your caregiver). You may then gradually begin a low-fiber diet as tolerated.  °A low-fiber diet is a diet with less than 10 grams of fiber. Choose the foods below to reduce fiber in the diet: °· White breads, cereals, rice, and pasta. °· Cooked fruits and vegetables or soft fresh fruits and vegetables without the skin. °· Ground or  well-cooked tender beef, ham, veal, lamb, pork, or poultry. °· Eggs and seafood. °· After your diverticulitis symptoms have improved, your caregiver may put you on a high-fiber diet. A high-fiber diet includes 14 grams of fiber for every 1000 calories consumed. For a standard 2000 calorie diet, you would need 28 grams of fiber. Follow these diet guidelines to help you increase the fiber in your diet. It is important to slowly increase the amount fiber in your diet to avoid gas, constipation, and bloating. °· Choose whole-grain breads, cereals, pasta, and brown rice. °· Choose fresh fruits and vegetables with the skin on. Do not overcook vegetables because the more vegetables are cooked, the more fiber is lost. °· Choose more nuts, seeds, legumes, dried peas, beans, and lentils. °· Look for food products that have greater than 3 grams of fiber per serving on the Nutrition Facts label. °· Take all medicine as directed by your caregiver. °· If your caregiver has given you a follow-up appointment, it is very important that you go. Not going could result in lasting (chronic) or permanent injury, pain, and disability. If there is any problem keeping the appointment, call to reschedule. °SEEK MEDICAL CARE IF:  °· Your pain does not improve. °· You have a hard time advancing your diet beyond clear liquids. °· Your bowel movements do not return to normal. °SEEK IMMEDIATE MEDICAL CARE IF:  °· Your pain becomes worse. °· You have an oral temperature above 102° F (38.9° C), not controlled by medicine. °· You have repeated vomiting. °· You have bloody or black, tarry stools. °·   Symptoms that brought you to your caregiver become worse or are not getting better. °MAKE SURE YOU:  °· Understand these instructions. °· Will watch your condition. °· Will get help right away if you are not doing well or get worse. °Document Released: 12/06/2004 Document Revised: 05/21/2011 Document Reviewed: 04/03/2010 °[employer]® Patient Information  ©2014 [employer]. ° °

## 2012-12-30 ENCOUNTER — Encounter: Payer: Self-pay | Admitting: Internal Medicine

## 2012-12-30 LAB — COMPREHENSIVE METABOLIC PANEL
Albumin: 4.2 g/dL (ref 3.5–5.2)
Alkaline Phosphatase: 46 U/L (ref 39–117)
CO2: 25 mEq/L (ref 19–32)
Calcium: 8.8 mg/dL (ref 8.4–10.5)
Creat: 0.74 mg/dL (ref 0.50–1.10)
Sodium: 136 mEq/L (ref 135–145)
Total Bilirubin: 0.4 mg/dL (ref 0.3–1.2)

## 2012-12-31 LAB — URINE CULTURE: Colony Count: 40000

## 2013-01-05 ENCOUNTER — Other Ambulatory Visit: Payer: Self-pay | Admitting: Medical

## 2013-01-05 MED ORDER — NYSTATIN 100000 UNIT/ML MT SUSP: 500000.0000 [IU] | Freq: Four times a day (QID) | OROMUCOSAL | Status: DC

## 2013-06-26 ENCOUNTER — Other Ambulatory Visit: Payer: Self-pay | Admitting: Medical

## 2013-06-26 NOTE — Telephone Encounter (Signed)
Message was left for pt to call office. Per shane pt. Needs cpe.

## 2013-06-26 NOTE — Telephone Encounter (Signed)
Needs physical visit.  Last physical/discussion of premarin 06/2012.

## 2013-06-26 NOTE — Telephone Encounter (Signed)
Is this okay to refill? 

## 2013-07-02 ENCOUNTER — Other Ambulatory Visit: Payer: Self-pay | Admitting: Medical

## 2013-07-02 ENCOUNTER — Telehealth: Payer: Self-pay | Admitting: Medical

## 2013-07-02 DIAGNOSIS — N959 Unspecified menopausal and perimenopausal disorder: Secondary | ICD-10-CM

## 2013-07-02 MED ORDER — ESTROGENS CONJUGATED 0.3 MG PO TABS: 0.3000 mg | ORAL_TABLET | Freq: Every day | ORAL | Status: DC

## 2013-07-02 NOTE — Telephone Encounter (Signed)
Pt scheduled appt for 07/23/13 with you, she is out of her Premarin & needs RF to hold her til appt

## 2013-07-23 ENCOUNTER — Encounter: Payer: Self-pay | Admitting: Medical

## 2013-07-23 ENCOUNTER — Ambulatory Visit (INDEPENDENT_AMBULATORY_CARE_PROVIDER_SITE_OTHER): Payer: PRIVATE HEALTH INSURANCE | Admitting: Medical

## 2013-07-23 VITALS — BP 120/78 | HR 76 | Temp 98.2°F | Resp 18 | Ht 64.5 in | Wt 147.0 lb

## 2013-07-23 DIAGNOSIS — Z0000 Encounter for general adult medical examination without abnormal findings: Secondary | ICD-10-CM

## 2013-07-23 DIAGNOSIS — N959 Unspecified menopausal and perimenopausal disorder: Secondary | ICD-10-CM

## 2013-07-23 DIAGNOSIS — R51 Headache: Secondary | ICD-10-CM

## 2013-07-23 DIAGNOSIS — K589 Irritable bowel syndrome without diarrhea: Secondary | ICD-10-CM

## 2013-07-23 DIAGNOSIS — E7800 Pure hypercholesterolemia, unspecified: Secondary | ICD-10-CM

## 2013-07-23 DIAGNOSIS — D6489 Other specified anemias: Secondary | ICD-10-CM

## 2013-07-23 LAB — POCT URINALYSIS DIPSTICK
Bilirubin, UA: NEGATIVE
Blood, UA: NEGATIVE
GLUCOSE UA: NEGATIVE
KETONES UA: NEGATIVE
Leukocytes, UA: NEGATIVE
Nitrite, UA: NEGATIVE
Protein, UA: NEGATIVE
Spec Grav, UA: <=1.005
Urobilinogen, UA: NEGATIVE
pH, UA: 5

## 2013-07-23 NOTE — Progress Notes (Signed)
Subjective:   HPI  Carol Murphy is a 63 y.o. female who presents for a complete physical.  Preventative care: Last ophthalmology visit:2014 [facility] Last dental visit:2015  Last colonoscopy:1-2 years ago Dr. Madilyn FiremanHayes Last mammogram:2014 Last gynecological exam:yrs ago Last EKG:yrs ago Last labs:2015  Prior vaccinations: TD or Tdap:unsure Influenza:2014 Pneumococcal:no Shingles/Zostavax:2014  Advanced directive:no Health care power of attorney:no Living will:no  Concerns: Over the past year has intermittent headaches, sometimes when she gets up in the middle of the night to urinate will have a headache. No double vision no vision change no numbness tingling or weakness, no fevers or night sweats or weight loss. Headaches resolve hours later    Reviewed their medical, surgical, family, social, medication, and allergy history and updated chart as appropriate.  Past Medical History  Diagnosis Date  . IBS (irritable bowel syndrome)   . Pure hypercholesterolemia   . Osteopenia     DEXA '09 T-1.7 at hip ([facility])  . Allergic rhinitis, cause unspecified   . Diverticulosis   . Unspecified vitamin D deficiency   . Endometriosis     s/p TAH-BSO age 63  . DJD (degenerative joint disease)     low back, hips, knees, arthritis in thumbs  . Former smoker     quit 2006  . Anemia     low-normal B12, resolved on B12  . History of cardiovascular stress test 07/19/04    age 63yo, normal cardiolite study, Dr. Jacinto HalimGanji  . H/O echocardiogram 04/24/2006    normal study, EF>55%, mild tricuspid regurgitation; Dr. Jacinto HalimGanji  . H/O bone density study 04/04/12, 2009    low bone mass, improved from 2009 study, consider repeat 3 years  . H/O mammogram 04/04/12    normal  . Wears glasses     Past Surgical History  Procedure Laterality Date  . Total abdominal hysterectomy w/ bilateral salpingoophorectomy  age 63    endometriosis  . Appendectomy  1970  . Osteotomy      [facility], Dr. Thurston HoleWainer  . Total  knee arthroplasty  2000, 2004    Dr. Thurston HoleWainer, then Dr. Sherlean FootLucey  . Colonoscopy  9/07, 11/2011    q5 years (Dr. Madilyn FiremanHayes)  . Orif wrist fracture  2001    right    History   Social History  . Marital Status: Significant Other    Spouse Name: N/A    Number of Children: 0  . Years of Education: N/A   Occupational History  . 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 Smoking status: Former Smoker -- 1.00 packs/day for 25 years    Quit date: 03/12/2004  . Smokeless tobacco: Never Used  . Alcohol Use: Yes     Comment: 1-2 month  . Drug Use: No  . Sexual Activity: Not on file   Other Topics Concern  . Not on file   Social History Narrative   Lives with Milas HockMary Corbin, her female partner, and her 65 year old daughter, and 2 chihuahuas.  Exercise - walks most days;  Works at [employer].    Family History  Problem Relation Age of Onset  . COPD Mother   . Colon polyps Mother   . Hypertension Father   . Hyperlipidemia Father   . Heart disease Father 3952    died of MI  . Diabetes Father     diet controlled  . Cancer Paternal Grandmother     stomach  . Heart disease Paternal Aunt   . Heart  disease Paternal Uncle   . Stroke Neg Hx     Current outpatient prescriptions:atorvastatin (LIPITOR) 40 MG tablet, Take 40 mg by mouth daily.  , Disp: , Rfl: ;  butalbital-acetaminophen-caffeine (FIORICET, ESGIC) 50-325-40 MG per tablet, Take 1 tablet by mouth as needed.  , Disp: , Rfl: ;  Calcium-Vitamin D 600-200 MG-UNIT per tablet, Take 1 tablet by mouth daily. A , Disp: , Rfl:  ciprofloxacin (CIPRO) 750 MG tablet, Take 1 tablet (750 mg total) by mouth 2 (two) times daily., Disp: 20 tablet, Rfl: 0;  Cyanocobalamin (VITAMIN B-12 IJ), Inject 1,000 mcg as directed every 21 ( twenty-one) days. , Disp: , Rfl: ;  dicyclomine (BENTYL) 20 MG tablet, Take 20 mg by mouth as needed. , Disp: , Rfl: ;  estrogens, conjugated, (PREMARIN) 0.3 MG tablet, Take 1 tablet (0.3 mg total) by  mouth daily., Disp: 30 tablet, Rfl: 0 pseudoephedrine (SUDAFED) 30 MG tablet, Take 30 mg by mouth every 4 (four) hours as needed., Disp: , Rfl: ;  diphenhydramine-acetaminophen (TYLENOL PM) 25-500 MG TABS, Take 1 tablet by mouth at bedtime as needed., Disp: , Rfl: ;  fluticasone (FLONASE) 50 MCG/ACT nasal spray, Place 2 sprays into the nose daily., Disp: 16 g, Rfl: 5 HYDROcodone-acetaminophen (NORCO/VICODIN) 5-325 MG per tablet, Take 1 tablet by mouth every 6 (six) hours as needed for pain., Disp: 20 tablet, Rfl: 0;  loratadine (CLARITIN) 10 MG tablet, Take 10 mg by mouth daily., Disp: , Rfl: ;  metroNIDAZOLE (FLAGYL) 500 MG tablet, Take 1 tablet (500 mg total) by mouth 3 (three) times daily., Disp: 30 tablet, Rfl: 0 nystatin (MYCOSTATIN) 100000 UNIT/ML suspension, Take 5 mLs (500,000 Units total) by mouth 4 (four) times daily., Disp: 60 mL, Rfl: 0;  traMADol (ULTRAM) 50 MG tablet, Take 1 tablet (50 mg total) by mouth every 6 (six) hours as needed for pain., Disp: 30 tablet, Rfl: 0  Allergies  Allergen Reactions  . Morphine And Related Other (See Comments)    hallucinations  . Penicillins Other (See Comments)    Gi upset        Review of Systems Constitutional: -fever, -chills, -sweats, -unexpected weight change, -decreased appetite, -fatigue Allergy: -sneezing, -itching, -congestion Dermatology: -changing moles, --rash, -lumps ENT: -runny nose, -ear pain, -sore throat, -hoarseness, +sinus pain, -teeth pain, - ringing in ears, -hearing loss, -nosebleeds Cardiology: -chest pain, -palpitations, -swelling, -difficulty breathing when lying flat, -waking up short of breath Respiratory: -cough, -shortness of breath, -difficulty breathing with exercise or exertion, -wheezing, -coughing up blood Gastroenterology: -abdominal pain, -nausea, -vomiting, -diarrhea, -constipation, -blood in stool, -changes in bowel movement, -difficulty swallowing or eating Hematology: -bleeding, -bruising   Musculoskeletal: - +joint aches, +muscle aches, -joint swelling, -back pain, -neck pain, -cramping, -changes in gait Ophthalmology: denies vision changes, eye redness, itching, discharge Urology: -burning with urination, -difficulty urinating, -blood in urine, -urinary frequency, -urgency, -incontinence Neurology: -headache, -weakness, -tingling, -numbness, -memory loss, -falls, -dizziness Psychology: -depressed mood, -agitation, +sleep problems     Objective:   Physical Exam  BP 120/78  Pulse 76  Temp(Src) 98.2 F (36.8 C) (Oral)  Resp 18  Ht 5' 4.5" (1.638 m)  Wt 147 lb (66.679 kg)  BMI 24.85 kg/m2  General appearance: alert, no distress, WD/WN, white female  Skin: scattered benign appearing lesions, no worrisome lesions  HEENT: normocephalic, conjunctiva/corneas normal, sclerae anicteric, PERRLA, EOMi, nares patent, no discharge or erythema, pharynx normal  Oral cavity: MMM, tongue normal, teeth in good repair  Neck: supple, no lymphadenopathy, no thyromegaly, no  masses, normal ROM, no bruits  Chest: non tender, normal shape and expansion  Heart: RRR, normal S1, S2, no murmurs  Lungs: CTA bilaterally, no wheezes, rhonchi, or rales  Abdomen: +bs, soft, non tender, non distended, no masses, no hepatomegaly, no splenomegaly, no bruits  Back: non tender, normal ROM, no scoliosis  Musculoskeletal: upper extremities non tender, no obvious deformity, normal ROM throughout, lower extremities non tender, no obvious deformity, normal ROM throughout  Extremities: no edema, no cyanosis, no clubbing  Pulses: 1+ pedal pulses, somewhat difficult to palpate, otherwise 2+ symmetric, upper extremity pulses, normal cap refill  Neurological: alert, oriented x 3, CN2-12 intact, strength normal upper extremities and lower extremities, sensation normal throughout, DTRs 2+ throughout, no cerebellar signs, gait normal  Psychiatric: normal affect, behavior normal, pleasant  Breast/gyn-review last years  exam findings, exam deferred today Rectal:deferred     Assessment and Plan :    Encounter Diagnosis  Name Primary?  . Routine general medical examination at a health care facility Yes      Physical exam - discussed healthy lifestyle, diet, exercise, preventative care, vaccinations, and addressed their concerns.  Handout given. Continue medications age usual, I reviewed her recent labs which are all fine Refilled medications Advise she keep a headache diary, try to avoid triggers, begin daily bedtime antihistamine, and if headaches are really no different within the next month we should consider treatment or scan of brain She is aware of the risk and benefits of Premarin hormone therapy, continue as she is done well on this Follow-up within a month regarding headaches

## 2013-07-24 ENCOUNTER — Encounter: Payer: Self-pay | Admitting: Medical

## 2013-07-24 MED ORDER — DICYCLOMINE HCL 20 MG PO TABS: 20.0000 mg | ORAL_TABLET | Freq: Three times a day (TID) | ORAL | Status: DC

## 2013-07-24 MED ORDER — ATORVASTATIN CALCIUM 40 MG PO TABS: 40.0000 mg | ORAL_TABLET | Freq: Every day | ORAL | Status: DC

## 2013-07-24 MED ORDER — FLUTICASONE PROPIONATE 50 MCG/ACT NA SUSP: 2.0000 | Freq: Every day | NASAL | Status: DC

## 2013-07-24 MED ORDER — LORATADINE 10 MG PO TABS: 10.0000 mg | ORAL_TABLET | Freq: Every day | ORAL | Status: DC

## 2013-07-24 MED ORDER — ESTROGENS CONJUGATED 0.3 MG PO TABS: 0.3000 mg | ORAL_TABLET | Freq: Every day | ORAL | Status: DC

## 2013-08-07 ENCOUNTER — Encounter: Payer: Self-pay | Admitting: Medical

## 2013-11-04 ENCOUNTER — Encounter: Payer: Self-pay | Admitting: Medical

## 2013-11-04 ENCOUNTER — Ambulatory Visit (INDEPENDENT_AMBULATORY_CARE_PROVIDER_SITE_OTHER): Payer: PRIVATE HEALTH INSURANCE | Admitting: Medical

## 2013-11-04 VITALS — BP 130/80 | HR 62 | Temp 97.8°F | Resp 14 | Wt 148.0 lb

## 2013-11-04 DIAGNOSIS — R51 Headache: Secondary | ICD-10-CM

## 2013-11-04 DIAGNOSIS — R519 Headache, unspecified: Secondary | ICD-10-CM

## 2013-11-04 MED ORDER — METHYLPREDNISOLONE (PAK) 4 MG PO TABS: ORAL_TABLET | ORAL | Status: DC

## 2013-11-04 MED ORDER — MOXIFLOXACIN HCL 400 MG PO TABS: 400.0000 mg | ORAL_TABLET | Freq: Every day | ORAL | Status: DC

## 2013-11-04 NOTE — Progress Notes (Signed)
Subjective:    Carol Murphy is a 63 y.o. female who presents for evaluation of headaches.   She notes headache for the past 3-4 years, more frequent of late.  Thinks it could be allergy related, but the last 2 headaches involved left ear and discomfort in left neck and arm.  Ear felt clogged like if underwater.   Headache awoke her from sleep this morning 3:30am.  Took tylenol and this helped.   Getting headaches every other week.  Usually headaches behind eyes, mostly left.  headache is usually constant, sometimes in back of head and neck.   Headaches can be any time, day or night, sporadic.   rarely has to lie down for headache.  Hasn't missed work due to headache.   Denies associated hearing or vision changes. No numbness, tingling, weakness.  Sometimes gets nausea with the headaches.   At times does feel dizzy.  No recent allergy symptoms other than sinus pressure, but no sneezing,runny nose, itchy eyes or nose.  No cough, no chest congestion.   No vomiting.  No incontinence.   Usually uses Excedrin, and if this doesn't work, uses Fiorinal.  The patient rates the pain a 8 on a scale from 1 to 10.  No nosebleeds. Seeing eye doctor in November for routine f/u.  No aura.  Not using daily analgesics. Not skipping meals . Denies any major stress. No sleep concerns no history of sleep apnea and snoring no witnessed apnea. No other aggravating or relieving factors.. No other complaint.  The following portions of the patient's history were reviewed and updated as appropriate: allergies, current medications, past family history, past medical history, past social history, past surgical history and problem list.  Review of Systems Constitutional: -fever, -chills, -sweats, -unexpected weight change Cardiology:  -chest pain, -palpitations, -edema, - hx/o pulsating artery in temple Respiratory: -cough, -shortness of breath, -wheezing Gastroenterology: -abdominal pain, +nausea, -vomiting, -diarrhea, -constipation   Hematology: -bleeding, some increased bruising of forearms with age. Musculoskeletal: - no stiff neck, -arthralgias, -myalgias, -joint swelling, -back pain Ophthalmology: -vision changes, - vision loss, -double vision Urology: -dysuria, -difficulty urinating, -hematuria, -urinary frequency, -urgency Neurology: -weakness, -tingling, -numbness, -speech changes, -urinary or bowel incontinence, -LOC, -altered mental status.   Other: No recent head injury or concussion, no recent fasting from meals, no recent change in caffeine use, headaches are not worsened by exertion Psych: -no recent change in stress level   Past Medical History  Diagnosis Date  . IBS (irritable bowel syndrome)   . Pure hypercholesterolemia   . Osteopenia     DEXA '09 T-1.7 at hip ([facility])  . Allergic rhinitis, cause unspecified   . Diverticulosis   . Unspecified vitamin D deficiency   . Endometriosis     s/p TAH-BSO age 75  . DJD (degenerative joint disease)     low back, hips, knees, arthritis in thumbs  . Former smoker     quit 2006  . Anemia     low-normal B12, resolved on B12  . History of cardiovascular stress test 07/19/04    age 78yo, normal cardiolite study, Dr. Jacinto Halim  . H/O echocardiogram 04/24/2006    normal study, EF>55%, mild tricuspid regurgitation; Dr. Jacinto Halim  . H/O bone density study 04/04/12, 2009    low bone mass, improved from 2009 study, consider repeat 3 years  . H/O mammogram 04/04/12    normal  . Wears glasses    Family History  Problem Relation Age of Onset  . COPD Mother   .  Colon polyps Mother   . Hypertension Father   . Hyperlipidemia Father   . Heart disease Father 35    died of MI  . Diabetes Father     diet controlled  . Cancer Paternal Grandmother     stomach  . Heart disease Paternal Aunt   . Heart disease Paternal Uncle   . Stroke Neg Hx       Objective:   Physical Exam  Filed Vitals:   11/04/13 0819  BP: 130/80  Pulse: 62  Temp: 97.8 F (36.6 C)  Resp: 14    Wt Readings from Last 3 Encounters:  11/04/13 148 lb (67.132 kg)  07/23/13 147 lb (66.679 kg)  12/29/12 149 lb (67.586 kg)   BP Readings from Last 3 Encounters:  11/04/13 130/80  07/23/13 120/78  12/29/12 130/88    General appearance: alert, no distress, WD/WN HEENT: normocephalic, sclerae anicteric, PERRLA, EOMi, left TM flat, nares patent, no discharge or erythema, pharynx normal Oral cavity: MMM, no lesions Neck: supple, no lymphadenopathy, no thyromegaly, no masses, no bruits Heart: RRR, normal S1, S2, no murmurs Lungs: CTA bilaterally, no wheezes, rhonchi, or rales Musculoskeletal: nontender, no swelling, no obvious deformity Extremities: unremarkable Pulses: 2+ symmetric, upper and lower extremities Neurological: alert, oriented x 3, CN2-12 intact, strength normal upper extremities and lower extremities, sensation normal throughout, DTRs 2+ throughout, no cerebellar signs, gait normal Psychiatric: normal affect, behavior normal, pleasant     Assessment:   Encounter Diagnosis  Name Primary?  . Chronic nonintractable headache, unspecified headache type Yes    Plan:   We discussed her chronic headaches, discussed possible triggers that are common, discussed a wide differential.  At this point she has no clear picture other than possible chronic sinusitis and allergies.  She is already on daily Flonase and Claritin.  We will use a course of Medrol dose pack and Avelox for chronic sinusitis.   If no improvement at all and consider brain MRI.   However, if she gets improvement but relapses we'll refer to ENT.  She will call back within 2 weeks to update me on symptoms.  I reviewed her labs she had done at work and the only significant finding is hemoglobin A1c is 6.0%.  Rest of labs including CMET, lipids, thyroid, CBC normal

## 2013-11-06 ENCOUNTER — Telehealth: Payer: Self-pay | Admitting: Medical

## 2013-11-06 NOTE — Telephone Encounter (Signed)
I called the pharmacy and spoke to the pharmacist.  If she knows who told her the Avelox was a sulfa derivative then she needs to call back the pharmacy and advised them who told her this info as it is not true.  It really wouldn't matter since she doesn't have a sulfa allergy listed.  The chance of interaction with the Avelox and steroid is low however if she wants to be extra careful, then have her do the Avelox first, let's see how she does, then if still needed we'll do a round of the steroid

## 2013-11-06 NOTE — Telephone Encounter (Signed)
Patients partner who is listed on her HIPPA form is aware of the message and she she agrees to start with the Avelox and then if needed she will take the steriod. CLS

## 2013-11-06 NOTE — Telephone Encounter (Signed)
Pt called and stated that when she went to pick up her meds a advisory was given to her. On the medication advisory it stated Avelox should not be just with prednisone which She was also prescribed. The pharmacist also advised pt that Avelox is a derivative of sulfur drugs. She stated that she has adverse reactions to sulfur drugs. Pt would like to speak to you concerning these issues with medication. Pt [facility] [address]. She can be reached at [phone].

## 2013-11-09 ENCOUNTER — Encounter: Payer: Self-pay | Admitting: Medical

## 2013-11-20 ENCOUNTER — Telehealth: Payer: Self-pay | Admitting: Medical

## 2013-11-20 NOTE — Telephone Encounter (Signed)
Will refer to ENT on Monday. cls

## 2013-11-20 NOTE — Telephone Encounter (Signed)
I had mentioned if no improvement at all then consider brain MRI. However, if she gets improvement but relapses we'll refer to ENT.  So did she any improvement or has there been no change at all?

## 2013-11-20 NOTE — Telephone Encounter (Signed)
Lmom to cb. CLS 

## 2013-11-20 NOTE — Telephone Encounter (Signed)
Patient would like a referral to ENT first before having a MRI due to the cost. Is ENT, can't help then she will go for the MRI. CLS

## 2013-11-23 NOTE — Telephone Encounter (Signed)
Patient is aware of her appointment to see Dr. Suszanne Conners on 11/24/13 @ 400 pm [phone]. CLS

## 2013-11-25 ENCOUNTER — Telehealth: Payer: Self-pay | Admitting: Family Medicine

## 2013-11-25 NOTE — Telephone Encounter (Signed)
DR. Suszanne Conners [address] [place], [place]  [phone] 11/24/13 @ 400 PM

## 2013-11-30 ENCOUNTER — Other Ambulatory Visit (INDEPENDENT_AMBULATORY_CARE_PROVIDER_SITE_OTHER): Payer: Self-pay | Admitting: Otolaryngology

## 2013-11-30 DIAGNOSIS — J320 Chronic maxillary sinusitis: Secondary | ICD-10-CM

## 2013-12-04 ENCOUNTER — Ambulatory Visit
Admission: RE | Admit: 2013-12-04 | Discharge: 2013-12-04 | Disposition: A | Discharge status: Home / Self Care | Payer: PRIVATE HEALTH INSURANCE | Source: Ambulatory Visit | Attending: Otolaryngology | Admitting: Otolaryngology

## 2013-12-04 DIAGNOSIS — J320 Chronic maxillary sinusitis: Secondary | ICD-10-CM

## 2014-10-07 DIAGNOSIS — M25519 Pain in unspecified shoulder: Secondary | ICD-10-CM | POA: Insufficient documentation

## 2014-10-11 ENCOUNTER — Other Ambulatory Visit: Payer: Self-pay | Admitting: Orthopedic Surgery

## 2014-10-11 DIAGNOSIS — M25512 Pain in left shoulder: Secondary | ICD-10-CM

## 2014-10-17 ENCOUNTER — Other Ambulatory Visit: Payer: PRIVATE HEALTH INSURANCE

## 2014-11-05 ENCOUNTER — Ambulatory Visit
Admission: RE | Admit: 2014-11-05 | Discharge: 2014-11-05 | Disposition: A | Discharge status: Home / Self Care | Payer: PRIVATE HEALTH INSURANCE | Source: Ambulatory Visit | Attending: Orthopedic Surgery | Admitting: Orthopedic Surgery

## 2014-11-05 DIAGNOSIS — M25512 Pain in left shoulder: Secondary | ICD-10-CM

## 2014-11-09 DIAGNOSIS — Z96651 Presence of right artificial knee joint: Secondary | ICD-10-CM | POA: Insufficient documentation

## 2014-11-30 DIAGNOSIS — Z9889 Other specified postprocedural states: Secondary | ICD-10-CM | POA: Insufficient documentation

## 2015-05-03 ENCOUNTER — Other Ambulatory Visit: Payer: Self-pay | Admitting: Family Medicine

## 2015-05-03 ENCOUNTER — Telehealth: Payer: Self-pay | Admitting: Medical

## 2015-05-03 NOTE — Telephone Encounter (Signed)
Pt is aware and physical appt is this friday

## 2015-05-03 NOTE — Telephone Encounter (Signed)
Mammogram normal/no worrisome findings.  Set her up for physical appt.

## 2015-05-04 LAB — CMP12+LP+TP+TSH+6AC+CBC/D/PLT
ALBUMIN: 4.2 g/dL (ref 3.6–4.8)
ALK PHOS: 55 IU/L (ref 39–117)
ALT: 15 IU/L (ref 0–32)
AST: 19 IU/L (ref 0–40)
Albumin/Globulin Ratio: 1.8 (ref 1.1–2.5)
BASOS: 0 %
BUN / CREAT RATIO: 14 (ref 11–26)
BUN: 11 mg/dL (ref 8–27)
Basophils Absolute: 0 10*3/uL (ref 0.0–0.2)
Bilirubin Total: 0.3 mg/dL (ref 0.0–1.2)
CALCIUM: 9.4 mg/dL (ref 8.7–10.3)
CHLORIDE: 100 mmol/L (ref 96–106)
CHOLESTEROL TOTAL: 175 mg/dL (ref 100–199)
Chol/HDL Ratio: 2.5 ratio units (ref 0.0–4.4)
Creatinine, Ser: 0.78 mg/dL (ref 0.57–1.00)
EOS (ABSOLUTE): 0.1 10*3/uL (ref 0.0–0.4)
EOS: 3 %
Estimated CHD Risk: <0.5 times avg. (ref 0.0–1.0)
FREE THYROXINE INDEX: 1.5 (ref 1.2–4.9)
GFR calc Af Amer: 92 mL/min/{1.73_m2} (ref >59)
GFR calc non Af Amer: 79 mL/min/{1.73_m2} (ref >59)
GGT: 21 IU/L (ref 0–60)
Globulin, Total: 2.3 g/dL (ref 1.5–4.5)
Glucose: 91 mg/dL (ref 65–99)
HDL: 71 mg/dL (ref >39)
HEMATOCRIT: 43.5 % (ref 34.0–46.6)
HEMOGLOBIN: 14.2 g/dL (ref 11.1–15.9)
IMMATURE GRANULOCYTES: 0 %
Immature Grans (Abs): 0 10*3/uL (ref 0.0–0.1)
Iron: 128 ug/dL (ref 27–139)
LDH: 176 IU/L (ref 119–226)
LDL CALC: 83 mg/dL (ref 0–99)
Lymphocytes Absolute: 1.4 10*3/uL (ref 0.7–3.1)
Lymphs: 32 %
MCH: 30.5 pg (ref 26.6–33.0)
MCHC: 32.6 g/dL (ref 31.5–35.7)
MCV: 94 fL (ref 79–97)
MONOCYTES: 8 %
Monocytes Absolute: 0.4 10*3/uL (ref 0.1–0.9)
NEUTROS PCT: 57 %
Neutrophils Absolute: 2.5 10*3/uL (ref 1.4–7.0)
PHOSPHORUS: 4.2 mg/dL (ref 2.5–4.5)
Platelets: 234 10*3/uL (ref 150–379)
Potassium: 4.1 mmol/L (ref 3.5–5.2)
RBC: 4.65 x10E6/uL (ref 3.77–5.28)
RDW: 13.2 % (ref 12.3–15.4)
SODIUM: 142 mmol/L (ref 134–144)
T3 Uptake Ratio: 26 % (ref 24–39)
T4, Total: 5.9 ug/dL (ref 4.5–12.0)
TOTAL PROTEIN: 6.5 g/dL (ref 6.0–8.5)
TSH: 1.01 u[IU]/mL (ref 0.450–4.500)
Triglycerides: 104 mg/dL (ref 0–149)
Uric Acid: 4.1 mg/dL (ref 2.5–7.1)
VLDL CHOLESTEROL CAL: 21 mg/dL (ref 5–40)
WBC: 4.5 10*3/uL (ref 3.4–10.8)

## 2015-05-04 LAB — HGB A1C W/O EAG: Hgb A1c MFr Bld: 5.7 % — ABNORMAL HIGH (ref 4.8–5.6)

## 2015-05-05 ENCOUNTER — Encounter: Payer: Self-pay | Admitting: Family Medicine

## 2015-05-06 ENCOUNTER — Ambulatory Visit (INDEPENDENT_AMBULATORY_CARE_PROVIDER_SITE_OTHER): Payer: PRIVATE HEALTH INSURANCE | Admitting: Medical

## 2015-05-06 ENCOUNTER — Encounter: Payer: Self-pay | Admitting: Medical

## 2015-05-06 VITALS — BP 148/88 | HR 74 | Wt 155.0 lb

## 2015-05-06 DIAGNOSIS — F329 Major depressive disorder, single episode, unspecified: Secondary | ICD-10-CM

## 2015-05-06 DIAGNOSIS — Z0000 Encounter for general adult medical examination without abnormal findings: Secondary | ICD-10-CM

## 2015-05-06 DIAGNOSIS — J309 Allergic rhinitis, unspecified: Secondary | ICD-10-CM

## 2015-05-06 DIAGNOSIS — R030 Elevated blood-pressure reading, without diagnosis of hypertension: Secondary | ICD-10-CM | POA: Diagnosis not present

## 2015-05-06 DIAGNOSIS — F938 Other childhood emotional disorders: Secondary | ICD-10-CM | POA: Diagnosis not present

## 2015-05-06 DIAGNOSIS — M1990 Unspecified osteoarthritis, unspecified site: Secondary | ICD-10-CM

## 2015-05-06 DIAGNOSIS — K219 Gastro-esophageal reflux disease without esophagitis: Secondary | ICD-10-CM

## 2015-05-06 DIAGNOSIS — E7800 Pure hypercholesterolemia, unspecified: Secondary | ICD-10-CM

## 2015-05-06 DIAGNOSIS — K589 Irritable bowel syndrome without diarrhea: Secondary | ICD-10-CM | POA: Diagnosis not present

## 2015-05-06 DIAGNOSIS — F32A Depression, unspecified: Secondary | ICD-10-CM

## 2015-05-06 DIAGNOSIS — M8580 Other specified disorders of bone density and structure, unspecified site: Secondary | ICD-10-CM

## 2015-05-06 DIAGNOSIS — Z23 Encounter for immunization: Secondary | ICD-10-CM

## 2015-05-06 DIAGNOSIS — Z7189 Other specified counseling: Secondary | ICD-10-CM

## 2015-05-06 DIAGNOSIS — Z129 Encounter for screening for malignant neoplasm, site unspecified: Secondary | ICD-10-CM | POA: Diagnosis not present

## 2015-05-06 DIAGNOSIS — Z639 Problem related to primary support group, unspecified: Secondary | ICD-10-CM

## 2015-05-06 DIAGNOSIS — Z7185 Encounter for immunization safety counseling: Secondary | ICD-10-CM

## 2015-05-06 LAB — POCT URINALYSIS DIPSTICK
BILIRUBIN UA: NEGATIVE
Blood, UA: NEGATIVE
GLUCOSE UA: NEGATIVE
Ketones, UA: NEGATIVE
NITRITE UA: NEGATIVE
Protein, UA: NEGATIVE
Spec Grav, UA: 1.015
Urobilinogen, UA: NEGATIVE
pH, UA: 6.5

## 2015-05-06 MED ORDER — BUPROPION HCL ER (SR) 150 MG PO TB12: 150.0000 mg | ORAL_TABLET | Freq: Two times a day (BID) | ORAL | Status: DC

## 2015-05-06 MED ORDER — CYANOCOBALAMIN 1000 MCG/ML IJ SOLN: 1000.0000 ug | INTRAMUSCULAR | Status: DC

## 2015-05-06 MED ORDER — OMEPRAZOLE 20 MG PO CPDR: 20.0000 mg | DELAYED_RELEASE_CAPSULE | Freq: Every day | ORAL | Status: DC

## 2015-05-06 MED ORDER — IBUPROFEN 800 MG PO TABS: 800.0000 mg | ORAL_TABLET | Freq: Three times a day (TID) | ORAL | Status: DC | PRN

## 2015-05-06 MED ORDER — ATORVASTATIN CALCIUM 40 MG PO TABS: 40.0000 mg | ORAL_TABLET | Freq: Every day | ORAL | Status: DC

## 2015-05-09 ENCOUNTER — Telehealth: Payer: Self-pay | Admitting: Medical

## 2015-05-09 ENCOUNTER — Telehealth: Payer: Self-pay | Admitting: Family Medicine

## 2015-05-09 DIAGNOSIS — M19012 Primary osteoarthritis, left shoulder: Secondary | ICD-10-CM | POA: Insufficient documentation

## 2015-05-09 DIAGNOSIS — Z639 Problem related to primary support group, unspecified: Secondary | ICD-10-CM | POA: Insufficient documentation

## 2015-05-09 DIAGNOSIS — F32A Depression, unspecified: Secondary | ICD-10-CM | POA: Insufficient documentation

## 2015-05-09 DIAGNOSIS — J309 Allergic rhinitis, unspecified: Secondary | ICD-10-CM | POA: Insufficient documentation

## 2015-05-09 DIAGNOSIS — Z129 Encounter for screening for malignant neoplasm, site unspecified: Secondary | ICD-10-CM | POA: Insufficient documentation

## 2015-05-09 DIAGNOSIS — K219 Gastro-esophageal reflux disease without esophagitis: Secondary | ICD-10-CM | POA: Insufficient documentation

## 2015-05-09 DIAGNOSIS — Z23 Encounter for immunization: Secondary | ICD-10-CM | POA: Insufficient documentation

## 2015-05-09 DIAGNOSIS — E7800 Pure hypercholesterolemia, unspecified: Secondary | ICD-10-CM | POA: Insufficient documentation

## 2015-05-09 DIAGNOSIS — Z1239 Encounter for other screening for malignant neoplasm of breast: Secondary | ICD-10-CM | POA: Insufficient documentation

## 2015-05-09 DIAGNOSIS — Z7185 Encounter for immunization safety counseling: Secondary | ICD-10-CM | POA: Insufficient documentation

## 2015-05-09 DIAGNOSIS — Z7189 Other specified counseling: Secondary | ICD-10-CM | POA: Insufficient documentation

## 2015-05-09 DIAGNOSIS — K589 Irritable bowel syndrome without diarrhea: Secondary | ICD-10-CM | POA: Insufficient documentation

## 2015-05-09 DIAGNOSIS — R030 Elevated blood-pressure reading, without diagnosis of hypertension: Secondary | ICD-10-CM | POA: Insufficient documentation

## 2015-05-09 DIAGNOSIS — F329 Major depressive disorder, single episode, unspecified: Secondary | ICD-10-CM | POA: Insufficient documentation

## 2015-05-09 MED ORDER — LEVOCETIRIZINE DIHYDROCHLORIDE 5 MG PO TABS: 5.0000 mg | ORAL_TABLET | Freq: Every evening | ORAL | Status: DC

## 2015-05-09 MED ORDER — BUPROPION HCL ER (XL) 150 MG PO TB24: 150.0000 mg | ORAL_TABLET | Freq: Every day | ORAL | Status: DC

## 2015-05-09 NOTE — Telephone Encounter (Signed)
Sent in zyzal and spoke to pharmacy and fixed the wellbutrin.

## 2015-05-09 NOTE — Telephone Encounter (Signed)
Pt said when she was here Friday that you were going to refill her Xyzal to [facility] [address].  And she said it is not there.  Also that we should be receiving a call from [facility] that the Welbutrin should be Xr or XL not SR

## 2015-05-09 NOTE — Telephone Encounter (Signed)
[facility] called stating that med that was sent in to them on Friday was Bupropion SR and pt has picked it up but pt called them stating that this was not the correct med so pharmacy looked back in history and noticed that pt normally get Bupropion XL. Was pt supposed to have gotten XL instead?

## 2015-05-09 NOTE — Telephone Encounter (Signed)
LMTCB

## 2015-05-09 NOTE — Progress Notes (Signed)
Subjective:   HPI  Carol Murphy is a 65 y.o. female who presents for a complete physical.  Chief Complaint  Patient presents with  . Annual Exam    utd blood work in [place], [employer]. sees podiatrist,  [facility]. dr Viviann Spare lucy, ortho. no other doctors. recently had a 23 year relationship end and is having some trouble with that but is on her welbutrin which helps   Medical team:  On site NP/PA at employment at [employer]  Podiatry  Ortho, Dr. Darcella Cheshire  Sees eye doctor and dentist  Sees dermatology every now and then Ernst Breach, PA-C here for primary care  Concerns: She gets yearly labs free at work, has the results with her today  She wants refills, and needs some sent to 3 different pharmacies.  Gets flex spending on most through pharmacy at work  recently her partner of 23 years ended their relationship.  This has been hard, using her Wellbutrin currently which helps.      Has skin lesions for me to look at  Reviewed their medical, surgical, family, social, medication, and allergy history and updated chart as appropriate.  Past Medical History  Diagnosis Date  . IBS (irritable bowel syndrome)   . Pure hypercholesterolemia   . Osteopenia     DEXA '09 T-1.7 at hip ([facility])  . Allergic rhinitis, cause unspecified   . Diverticulosis   . Unspecified vitamin D deficiency   . Endometriosis     s/p TAH-BSO age 36  . DJD (degenerative joint disease)     low back, hips, knees, arthritis in thumbs  . Former smoker     quit 2006  . Anemia     low-normal B12, resolved on B12  . History of cardiovascular stress test 07/19/04    age 58yo, normal cardiolite study, Dr. Jacinto Halim  . H/O echocardiogram 04/24/2006    normal study, EF>55%, mild tricuspid regurgitation; Dr. Jacinto Halim  . H/O bone density study 04/04/12, 2009    low bone mass, improved from 2009 study, consider repeat 3 years  . H/O mammogram 04/04/12    normal  . Wears glasses     Past  Surgical History  Procedure Laterality Date  . Total abdominal hysterectomy w/ bilateral salpingoophorectomy  age 79    endometriosis  . Appendectomy  1970  . Osteotomy      [facility], Dr. Thurston Hole  . Total knee arthroplasty  2000, 2004    Dr. Thurston Hole, then Dr. Sherlean Foot  . Colonoscopy  9/07, 11/2011    q5 years (Dr. Madilyn Fireman)  . Orif wrist fracture  2001    right    Social History   Social History  . Marital Status: Significant Other    Spouse Name: N/A  . Number of Children: 0  . Years of Education: N/A   Occupational History  . 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 Smoking status: Former Smoker -- 1.00 packs/day for 25 years    Quit date: 03/12/2004  . Smokeless tobacco: Never Used  . Alcohol Use: Yes     Comment: 1-2 month  . Drug Use: No  . Sexual Activity: Not on file   Other Topics Concern  . Not on file   Social History Narrative   Lives with Carol Murphy, her female partner, and her 53 year old daughter, and 2 chihuahuas.  Exercise - walks most days;  Works at [employer].    Family History  Problem Relation Age  of Onset  . COPD Mother   . Colon polyps Mother   . Hypertension Father   . Hyperlipidemia Father   . Heart disease Father 103    died of MI  . Diabetes Father     diet controlled  . Cancer Paternal Grandmother     stomach  . Heart disease Paternal Aunt   . Heart disease Paternal Uncle   . Stroke Neg Hx      Current outpatient prescriptions:  .  atorvastatin (LIPITOR) 40 MG tablet, Take 1 tablet (40 mg total) by mouth daily., Disp: 90 tablet, Rfl: 3 .  buPROPion (WELLBUTRIN SR) 150 MG 12 hr tablet, Take 1 tablet (150 mg total) by mouth 2 (two) times daily., Disp: 60 tablet, Rfl: 5 .  butalbital-aspirin-caffeine-codeine (FIORINAL WITH CODEINE) 50-325-40-30 MG capsule, Take 1 capsule by mouth every 4 (four) hours as needed for pain., Disp: , Rfl:  .  Calcium-Vitamin D 600-200 MG-UNIT per tablet, Take 1 tablet by mouth  daily. A , Disp: , Rfl:  .  Cyanocobalamin (VITAMIN B-12 IJ), Inject 1,000 mcg as directed every 21 ( twenty-one) days. , Disp: , Rfl:  .  diclofenac sodium (VOLTAREN) 1 % GEL, Apply topically daily., Disp: , Rfl:  .  omeprazole (PRILOSEC) 20 MG capsule, Take 1 capsule (20 mg total) by mouth daily., Disp: 90 capsule, Rfl: 3 .  cyanocobalamin (,VITAMIN B-12,) 1000 MCG/ML injection, Inject 1 mL (1,000 mcg total) into the muscle every 21 ( twenty-one) days., Disp: 30 mL, Rfl: 5 .  dicyclomine (BENTYL) 20 MG tablet, Take 1 tablet (20 mg total) by mouth 3 (three) times daily before meals., Disp: 60 tablet, Rfl: 2 .  diphenhydramine-acetaminophen (TYLENOL PM) 25-500 MG TABS, Take 1 tablet by mouth at bedtime as needed. Reported on 05/06/2015, Disp: , Rfl:  .  ibuprofen (ADVIL,MOTRIN) 800 MG tablet, Take 1 tablet (800 mg total) by mouth every 8 (eight) hours as needed., Disp: 90 tablet, Rfl: 0 .  levocetirizine (XYZAL) 5 MG tablet, Take 5 mg by mouth every evening. Reported on 05/06/2015, Disp: , Rfl:   Allergies  Allergen Reactions  . Morphine And Related Other (See Comments)    hallucinations  . Penicillins Other (See Comments)    Gi upset    Review of Systems Constitutional: -fever, -chills, -sweats, -unexpected weight change, -decreased appetite, -fatigue Allergy: -sneezing, -itching, -congestion Dermatology: -changing moles, --rash, -lumps ENT: -runny nose, -ear pain, -sore throat, -hoarseness, -sinus pain, -teeth pain, - ringing in ears, -hearing loss, -nosebleeds Cardiology: -chest pain, -palpitations, -swelling, -difficulty breathing when lying flat, -waking up short of breath Respiratory: -cough, -shortness of breath, -difficulty breathing with exercise or exertion, -wheezing, -coughing up blood Gastroenterology: -abdominal pain, -nausea, -vomiting, -diarrhea, -constipation, -blood in stool, -changes in bowel movement, -difficulty swallowing or eating Hematology: -bleeding, -bruising   Musculoskeletal: +joint aches, +muscle aches, -joint swelling, -back pain, -neck pain, -cramping, -changes in gait Ophthalmology: denies vision changes, eye redness, itching, discharge Urology: -burning with urination, -difficulty urinating, -blood in urine, -urinary frequency, -urgency, -incontinence Neurology: -headache, -weakness, -tingling, -numbness, -memory loss, -falls, -dizziness Psychology: +depressed mood, -agitation, +sleep problems     Objective:   Physical Exam  BP 148/88 mmHg  Pulse 74  Wt 155 lb (70.308 kg)  General appearance: alert, no distress, WD/WN, white female  Skin: scattered benign appearing lesions, no worrisome lesions, right posterolateral shoulder with 1cm diameter stuck on appearing brown uniform raised lesion, suggestive of seborrheic dermatosis  HEENT: normocephalic, conjunctiva/corneas normal, sclerae anicteric, PERRLA,  EOMi, nares patent, no discharge or erythema, pharynx normal  Oral cavity: MMM, tongue normal, teeth in good repair  Neck: supple, no lymphadenopathy, no thyromegaly, no masses, normal ROM, no bruits  Chest: non tender, normal shape and expansion  Heart: RRR, normal S1, S2, no murmurs  Lungs: CTA bilaterally, no wheezes, rhonchi, or rales  Abdomen: +bs, soft, +surgical scars noted central and RLQ abdomen, non tender, non distended, no masses, no hepatomegaly, no splenomegaly, no bruits  Back: non tender, normal ROM, no scoliosis  Musculoskeletal: surgical scar over posterior right wrist/hand, right knee anterior surgical scar, upper extremities non tender, no obvious deformity, normal ROM throughout, lower extremities non tender, no obvious deformity, normal ROM throughout  Extremities: no edema, no cyanosis, no clubbing  Pulses: 1+ pedal pulses, somewhat difficult to palpate, otherwise 2+ symmetric, upper extremity pulses, normal cap refill  Neurological: alert, oriented x 3, CN2-12 intact, strength normal upper extremities and lower  extremities, sensation normal throughout, DTRs 2+ throughout, no cerebellar signs, gait normal  Psychiatric: normal affect, behavior normal, pleasant  Breast: nontender, no masses or lumps, no skin changes, no nipple discharge or inversion, no axillary lymphadenopathy Gyn: atrophic changes mild noted of external vagina, vagina with somewhat dry mucosa, s/p hysterectomy, no abnormal vaginal discharge.  adnexa not enlarged, nontender, no masses.  Exam chaperoned by nurse. Rectal:deferred     Assessment and Plan :    Encounter Diagnoses  Name Primary?  . Routine general medical examination at a health care facility Yes  . Need for prophylactic vaccination against Streptococcus pneumoniae (pneumococcus)   . Elevated blood-pressure reading without diagnosis of hypertension   . Osteopenia   . Pure hypercholesterolemia   . Screening for cancer   . Vaccine counseling   . Allergic rhinitis, unspecified allergic rhinitis type   . IBS (irritable bowel syndrome)   . Depression   . Gastroesophageal reflux disease without esophagitis   . Relationship problems   . Osteoarthritis, unspecified osteoarthritis type, unspecified site      Physical exam - discussed healthy lifestyle, diet, exercise, preventative care, vaccinations, and addressed their concerns.  Reviewed her recent labs for CMET, CBC, lipid, thyroid, all fine except HgbA1C 5.7% She will monitor BP at work.  Goal of 130/80, but if running higher, she will return to discuss Reviewed bone density scan from 2014 See your eye doctor yearly for routine vision care. See your dentist yearly for routine dental care including hygiene visits twice yearly. See dermatology within the year She will check with GI as she is likely due for repeat colonoscopy 2017 late or early 2018 She is UTD on mammogram done recently and reviewed She gets flu shot at work Counseled on the pneumococcal vaccine.  Vaccine information sheet given.  Pneumococcal vaccine  Prevnar 13 given after consent obtained.  She will be due for pneumococcal 23 next year. C/t OTC dose of omeprazole for GERD. Discussed risks of medication C/t Wellbutrin for depression, consider counseling. 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  Diagnoses and all orders for this visit:  Routine general medical examination at a health care facility -     POCT urinalysis dipstick  Need for prophylactic vaccination against Streptococcus pneumoniae (pneumococcus) -  Pneumococcal conjugate vaccine 13-valent IM  Elevated blood-pressure reading without diagnosis of hypertension  Osteopenia  Pure hypercholesterolemia  Screening for cancer  Vaccine counseling  Allergic rhinitis, unspecified allergic rhinitis type  IBS (irritable bowel syndrome)  Depression  Gastroesophageal reflux disease without esophagitis  Relationship problems  Osteoarthritis, unspecified osteoarthritis type, unspecified site  Other orders -     omeprazole (PRILOSEC) 20 MG capsule; Take 1 capsule (20 mg total) by mouth daily. -     atorvastatin (LIPITOR) 40 MG tablet; Take 1 tablet (40 mg total) by mouth daily. -     ibuprofen (ADVIL,MOTRIN) 800 MG tablet; Take 1 tablet (800 mg total) by mouth every 8 (eight) hours as needed. -     cyanocobalamin (,VITAMIN B-12,) 1000 MCG/ML injection; Inject 1 mL (1,000 mcg total) into the muscle every 21 ( twenty-one) days. -     buPROPion (WELLBUTRIN SR) 150 MG 12 hr tablet; Take 1 tablet (150 mg total) by mouth 2 (two) times daily.

## 2015-05-09 NOTE — Telephone Encounter (Signed)
FYI - Xyzal with an X.

## 2015-05-09 NOTE — Telephone Encounter (Signed)
pls call and verify dosing for these 2 and just double check dosing for other medications.   I thought this was all correct on Friday but apparently not.  I personally sent/printed all of her medications Friday, but maybe the dose type of Wellbutrin was put in wrong.  Please change to the correct ones in the chart and call pharmacy to correct the 2 requested medications.   Of note, she uses 3 different pharmacies depending on the medication.  Sorry for the confusion, but in the future just ask she bring the pill bottles in to avoid discrepancy.

## 2015-05-12 ENCOUNTER — Telehealth: Payer: Self-pay

## 2015-05-12 MED ORDER — SIMVASTATIN 40 MG PO TABS: 40.0000 mg | ORAL_TABLET | Freq: Every day | ORAL | Status: DC

## 2015-05-12 NOTE — Addendum Note (Signed)
Addended by: Kieth Brightly on: 05/12/2015 02:32 PM   Modules accepted: Orders, Medications, SmartSet

## 2015-05-12 NOTE — Telephone Encounter (Signed)
Spoke with pt when doing refills:  Print Simvastatin Ibuprofen Prilosec   [facility] wellbutrin Bentyl xyzal  [facility] b-12  Simvastatin was printed and placed on your desk to be signed

## 2015-09-08 ENCOUNTER — Other Ambulatory Visit: Payer: Self-pay | Admitting: Internal Medicine

## 2015-09-08 MED ORDER — BUTALBITAL-APAP-CAFFEINE 50-325-40 MG PO TABS: ORAL_TABLET | ORAL | Status: DC

## 2015-09-08 NOTE — Telephone Encounter (Signed)
Done and LM for pt

## 2015-09-08 NOTE — Telephone Encounter (Signed)
Call out medication 

## 2015-09-08 NOTE — Telephone Encounter (Signed)
Pt called and wanted a refill on her headache med. The med that was listed in meds was the wrong headache med as it had codeine in it and her rx bottle that she had at home did not say with codeine. i have put in correct med in. [facility] [address]. If there is a problem with calling in med, patient wants to be called so she doesn't go to the pharmacy if med is not there. [phone]

## 2015-10-25 ENCOUNTER — Other Ambulatory Visit: Payer: Self-pay | Admitting: Family Medicine

## 2015-10-26 LAB — CMP12+LP+TP+TSH+6AC+CBC/D/PLT
ALBUMIN: 4.4 g/dL (ref 3.6–4.8)
ALT: 16 IU/L (ref 0–32)
AST: 19 IU/L (ref 0–40)
Albumin/Globulin Ratio: 1.8 (ref 1.2–2.2)
Alkaline Phosphatase: 55 IU/L (ref 39–117)
BILIRUBIN TOTAL: 0.4 mg/dL (ref 0.0–1.2)
BUN/Creatinine Ratio: 13 (ref 12–28)
BUN: 10 mg/dL (ref 8–27)
Basophils Absolute: 0 10*3/uL (ref 0.0–0.2)
Basos: 0 %
CALCIUM: 9.1 mg/dL (ref 8.7–10.3)
CHLORIDE: 96 mmol/L (ref 96–106)
Chol/HDL Ratio: 2.3 ratio units (ref 0.0–4.4)
Cholesterol, Total: 165 mg/dL (ref 100–199)
Creatinine, Ser: 0.75 mg/dL (ref 0.57–1.00)
EOS (ABSOLUTE): 0.2 10*3/uL (ref 0.0–0.4)
Eos: 3 %
Free Thyroxine Index: 1.8 (ref 1.2–4.9)
GFR calc Af Amer: 96 mL/min/{1.73_m2} (ref >59)
GFR calc non Af Amer: 83 mL/min/{1.73_m2} (ref >59)
GGT: 17 IU/L (ref 0–60)
Globulin, Total: 2.4 g/dL (ref 1.5–4.5)
Glucose: 85 mg/dL (ref 65–99)
HDL: 71 mg/dL (ref >39)
Hematocrit: 44.5 % (ref 34.0–46.6)
Hemoglobin: 14.3 g/dL (ref 11.1–15.9)
IMMATURE GRANULOCYTES: 0 %
IRON: 117 ug/dL (ref 27–139)
Immature Grans (Abs): 0 10*3/uL (ref 0.0–0.1)
LDH: 193 IU/L (ref 119–226)
LDL Calculated: 81 mg/dL (ref 0–99)
Lymphocytes Absolute: 1.5 10*3/uL (ref 0.7–3.1)
Lymphs: 30 %
MCH: 31.2 pg (ref 26.6–33.0)
MCHC: 32.1 g/dL (ref 31.5–35.7)
MCV: 97 fL (ref 79–97)
MONOS ABS: 0.4 10*3/uL (ref 0.1–0.9)
Monocytes: 8 %
NEUTROS PCT: 59 %
Neutrophils Absolute: 2.9 10*3/uL (ref 1.4–7.0)
PLATELETS: 253 10*3/uL (ref 150–379)
Phosphorus: 4.4 mg/dL (ref 2.5–4.5)
Potassium: 4.1 mmol/L (ref 3.5–5.2)
RBC: 4.59 x10E6/uL (ref 3.77–5.28)
RDW: 13.2 % (ref 12.3–15.4)
SODIUM: 138 mmol/L (ref 134–144)
T3 Uptake Ratio: 28 % (ref 24–39)
T4, Total: 6.6 ug/dL (ref 4.5–12.0)
TSH: 1.25 u[IU]/mL (ref 0.450–4.500)
Total Protein: 6.8 g/dL (ref 6.0–8.5)
Triglycerides: 67 mg/dL (ref 0–149)
Uric Acid: 3.8 mg/dL (ref 2.5–7.1)
VLDL Cholesterol Cal: 13 mg/dL (ref 5–40)
WBC: 5 10*3/uL (ref 3.4–10.8)

## 2015-10-26 LAB — HGB A1C W/O EAG: Hgb A1c MFr Bld: 5.5 % (ref 4.8–5.6)

## 2016-01-05 DIAGNOSIS — Z96612 Presence of left artificial shoulder joint: Secondary | ICD-10-CM | POA: Insufficient documentation

## 2016-02-22 ENCOUNTER — Other Ambulatory Visit: Payer: Self-pay | Admitting: Medical

## 2016-02-22 ENCOUNTER — Telehealth: Payer: Self-pay | Admitting: Medical

## 2016-02-22 MED ORDER — DICLOFENAC SODIUM 1 % TD GEL: 2.0000 g | Freq: Four times a day (QID) | TRANSDERMAL | 2 refills | Status: AC

## 2016-02-22 NOTE — Telephone Encounter (Signed)
Pt called for refills of Voltaren. Please send to [facility] [address]

## 2016-02-25 ENCOUNTER — Other Ambulatory Visit: Payer: Self-pay | Admitting: Medical

## 2016-02-25 MED ORDER — ATORVASTATIN CALCIUM 40 MG PO TABS: 40.0000 mg | ORAL_TABLET | Freq: Every day | ORAL | 3 refills | Status: DC

## 2016-02-25 NOTE — Telephone Encounter (Signed)
Call out atorvastatin as long as patient is ok with the switch.  This may be cheaper.  If not , let me know. Vincenza HewsShane ----- Message ----- From: Loraine GripHaley S Workman, RN Sent: 02/21/2016   3:00 PM To: Jac Canavanavid S Nazareth Norenberg, PA-C Subject: Medication Clarification                       I'm the occupational health nurse at Debbie's employer.   We fill several of her medications here through our dispensary. She has been on Simvastatin 40mg  once daily. While she was out on leave for her shoulder replacement, we began carrying atorvastatin on formulary. I understand that you have previously wanted to start her on that. We wanted to see if you want her to continue the Simvastatin, which she is having no issues with currently, or switch to Atorvastatin.   If you would like to switch, could you please enter an order or update her med list with the new order. Once we have it on file, it will be good for us to fill for a year.  Thank you, Nance PewHaley Workman, BSN, RN

## 2016-02-27 NOTE — Telephone Encounter (Signed)
She is going to check with nurse to see if she is about to get more simvastatin , and call me back on tomorrow

## 2016-05-29 ENCOUNTER — Ambulatory Visit: Payer: Self-pay | Admitting: Registered Nurse

## 2016-05-29 VITALS — BP 140/84 | HR 78 | Temp 97.5°F

## 2016-05-29 DIAGNOSIS — J019 Acute sinusitis, unspecified: Secondary | ICD-10-CM

## 2016-05-29 MED ORDER — PREDNISONE 20 MG PO TABS: 20.0000 mg | ORAL_TABLET | Freq: Every day | ORAL | 0 refills | Status: AC

## 2016-05-29 NOTE — Patient Instructions (Signed)
Honey with lemon Hydrate Shower twice a day Continue xyzal 5mg  po daily Continue singulair 10mg  by mouth daily Continue flonase 1 spray each nostril twice a day Continue nasal saline 2 sprays each nostril every 2 hours while awake as needed for congestion

## 2016-05-29 NOTE — Progress Notes (Signed)
Subjective:    Patient ID: Carol Murphy, female    DOB: 1951/03/11, 66 y.o.   MRN: [id number]  66y/o single caucasian female with c/o of productive cough with green mucus x3 days initially nonproductive. Nasal congestion. Sore throat that worsens with eating/swallowing. Change of voice-raspy, deep. Afebrile. Bilateral ear pressure. Denies ear drainage.  Has tried honey helping a little.  Still taking her allergy medications-singulair, flonase, xyzal and nasal saline.  Showering at least once a day.  Denied exposure to sick contacts.  Denied fevers/sweats/chills/nausea/vomiting/rash/diarrhea      Review of Systems  Constitutional: Negative for activity change, appetite change, chills, diaphoresis, fatigue, fever and unexpected weight change.  HENT: Positive for congestion, ear pain, nosebleeds, postnasal drip, rhinorrhea, sinus pain, sinus pressure, sore throat and voice change. Negative for dental problem, drooling, ear discharge, facial swelling, hearing loss, mouth sores, sneezing, tinnitus and trouble swallowing.   Eyes: Negative for photophobia, pain, discharge, redness, itching and visual disturbance.  Respiratory: Positive for cough. Negative for choking, chest tightness, shortness of breath, wheezing and stridor.   Cardiovascular: Negative for chest pain, palpitations and leg swelling.  Gastrointestinal: Negative for abdominal distention, abdominal pain, blood in stool, constipation, diarrhea, nausea and vomiting.  Endocrine: Negative for cold intolerance and heat intolerance.  Genitourinary: Negative for difficulty urinating, dysuria and hematuria.  Musculoskeletal: Negative for arthralgias, back pain, gait problem, joint swelling, myalgias, neck pain and neck stiffness.  Skin: Negative for color change, pallor, rash and wound.  Allergic/Immunologic: Positive for environmental allergies. Negative for food allergies.  Neurological: Positive for headaches. Negative for dizziness,  tremors, seizures, syncope, facial asymmetry, speech difficulty, weakness, light-headedness and numbness.  Hematological: Negative for adenopathy. Does not bruise/bleed easily.  Psychiatric/Behavioral: Negative for agitation, behavioral problems, confusion and sleep disturbance.       Objective:   Physical Exam  Constitutional: She is oriented to person, place, and time. Vital signs are normal. She appears well-developed and well-nourished. She is active and cooperative.  Non-toxic appearance. She does not have a sickly appearance. She appears ill. No distress.  HENT:  Head: Normocephalic and atraumatic.  Right Ear: Hearing, external ear and ear canal normal. A middle ear effusion is present.  Left Ear: Hearing, external ear and ear canal normal. A middle ear effusion is present.  Nose: Mucosal edema and rhinorrhea present. No nose lacerations, sinus tenderness, nasal deformity, septal deviation or nasal septal hematoma. No epistaxis.  No foreign bodies. Right sinus exhibits maxillary sinus tenderness and frontal sinus tenderness. Left sinus exhibits maxillary sinus tenderness and frontal sinus tenderness.  Mouth/Throat: Uvula is midline and mucous membranes are normal. Mucous membranes are not pale, not dry and not cyanotic. She does not have dentures. No oral lesions. No trismus in the jaw. Normal dentition. No dental abscesses, uvula swelling, lacerations or dental caries. Posterior oropharyngeal edema and posterior oropharyngeal erythema present. No oropharyngeal exudate or tonsillar abscesses.  Cobblestoning posterior pharynx; bilateral TMs air fluid level clear; bilateral nasal turbinates edema/erythema; bilateral allergic shiners; hoarse voice; bilateral nasal turbinates edema/erythema clear discharge  Eyes: Conjunctivae, EOM and lids are normal. Pupils are equal, round, and reactive to light. Right eye exhibits no chemosis, no discharge, no exudate and no hordeolum. No foreign body present in  the right eye. Left eye exhibits no chemosis, no discharge, no exudate and no hordeolum. No foreign body present in the left eye. Right conjunctiva is not injected. Right conjunctiva has no hemorrhage. Left conjunctiva is not injected. Left conjunctiva has no hemorrhage. No  scleral icterus. Right eye exhibits normal extraocular motion and no nystagmus. Left eye exhibits normal extraocular motion and no nystagmus. Right pupil is round and reactive. Left pupil is round and reactive. Pupils are equal.  Neck: Trachea normal and normal range of motion. Neck supple. No tracheal tenderness, no spinous process tenderness and no muscular tenderness present. No neck rigidity. No tracheal deviation, no edema, no erythema and normal range of motion present. No thyroid mass and no thyromegaly present.  Cardiovascular: Normal rate, regular rhythm, S1 normal, S2 normal, normal heart sounds and intact distal pulses.  PMI is not displaced.  Exam reveals no gallop and no friction rub.   No murmur heard. Pulmonary/Chest: Effort normal and breath sounds normal. No accessory muscle usage or stridor. No respiratory distress. She has no decreased breath sounds. She has no wheezes. She has no rhonchi. She has no rales. She exhibits no tenderness.  Abdominal: Soft. She exhibits no distension.  Musculoskeletal: Normal range of motion. She exhibits no edema or tenderness.       Right shoulder: Normal.       Left shoulder: Normal.       Right elbow: Normal.      Left elbow: Normal.       Right hip: Normal.       Left hip: Normal.       Right knee: Normal.       Left knee: Normal.       Cervical back: Normal.       Right hand: Normal.       Left hand: Normal.  Lymphadenopathy:       Head (right side): No submental, no submandibular, no tonsillar, no preauricular, no posterior auricular and no occipital adenopathy present.       Head (left side): No submental, no submandibular, no tonsillar, no preauricular, no posterior  auricular and no occipital adenopathy present.    She has no cervical adenopathy.       Right cervical: No superficial cervical, no deep cervical and no posterior cervical adenopathy present.      Left cervical: No superficial cervical, no deep cervical and no posterior cervical adenopathy present.  Neurological: She is alert and oriented to person, place, and time. She has normal strength. She is not disoriented. She displays no atrophy and no tremor. No cranial nerve deficit or sensory deficit. She exhibits normal muscle tone. She displays no seizure activity. Coordination and gait normal. GCS eye subscore is 4. GCS verbal subscore is 5. GCS motor subscore is 6.  Skin: Skin is warm, dry and intact. No abrasion, no bruising, no burn, no ecchymosis, no laceration, no lesion, no petechiae and no rash noted. She is not diaphoretic. No cyanosis or erythema. No pallor. Nails show no clubbing.  Psychiatric: She has a normal mood and affect. Her speech is normal and behavior is normal. Judgment and thought content normal. Cognition and memory are normal.  Nursing note and vitals reviewed.         Assessment & Plan:  A-acute rhinosinusitis/bronchitis, seasonal allergic rhinitis  P-patient refused antibiotics stated most upset her stomach wants to try oral steroids instead and continue allergy medications.  Discussed possible side effects e.g. Decreased/increased appetite, insomnia.   Will follow up for re-evaluation if new or worsening symptoms e.g. Wheezing, hemoptysis, GI upset.  DIspensed from [facility] prednisone 10mg  tabs #21 RF0 take 2 po with breakfast x 10 days.   Suspect Viral illness exacerbating seasonal allergies: no evidence of invasive bacterial  infection, non toxic and well hydrated.  This is most likely self limiting viral infection.  I do not see where any further testing or imaging is necessary at this time.   I will suggest supportive care, rest, good hygiene and encourage the patient to take  adequate fluids.  Does not require work excuse.  flonase 1 spray each nostril BID prn, nasal saline 1-2 sprays each nostril prn q2h, motrin 800mg  po TID prn.  Discussed honey with lemon and salt water gargles for comfort also.  The patient is to return to clinic or EMERGENCY ROOM if symptoms worsen or change significantly e.g. fever, lethargy, SOB, wheezing.  Patient verbalized agreement and understanding of treatment plan.    Continue flonase 1 spray each nostril BID #1 RF0 at home previously dispensed from [facility], saline 2 sprays each nostril q2h prn congestion.  Start prednisone 20mg  po daily x10 days with breakfast #21 RF0  If no improvement within 48 hours RTC for re-evaluation.  No evidence of systemic bacterial infection, non toxic and well hydrated.  I do not see where any further testing or imaging is necessary at this time.   I will suggest supportive care, rest, good hygiene and encourage the patient to take adequate fluids.  The patient is to return to clinic or EMERGENCY ROOM if symptoms worsen or change significantly.   Patient verbalized agreement and understanding of treatment plan and had no further questions at this time.   P2:  Hand washing and cover cough  Usually no specific medical treatment is needed if a virus is causing the sore throat.  The throat most often gets better on its own within 5 to 7 days.  Antibiotic medicine does not cure viral pharyngitis.   For acute pharyngitis caused by bacteria, your healthcare provider will prescribe an antibiotic.  Marland Kitchen. Do not smoke.  Marland Kitchen. Avoid secondhand smoke and other air pollutants.  . Use a cool mist humidifier to add moisture to the air.  . Get plenty of rest.  . You may want to rest your throat by talking less and eating a diet that is mostly liquid or soft for a day or two.   Marland Kitchen. Nonprescription throat lozenges and mouthwashes should help relieve the soreness.   . Gargling with warm saltwater and drinking warm liquids may help.  (You can make  a saltwater solution by adding 1/4 teaspoon of salt to 8 ounces, or 240 mL, of warm water.)  . A nonprescription pain reliever such as aspirin, acetaminophen, or ibuprofen may ease general aches and pains.   FOLLOW UP with clinic provider if no improvements in the next 7-10 days.  Patient verbalized understanding of instructions and agreed with plan of care. P2:  Hand washing and diet.  Bronchitis simple, community acquired, may have started as viral (probably respiratory syncytial, parainfluenza, influenza, or adenovirus), but now evidence of acute purulent bronchitis with resultant bronchial edema and mucus formation.  Viruses are the most common cause of bronchial inflammation in otherwise healthy adults with acute bronchitis.  The appearance of sputum is not predictive of whether a bacterial infection is present.  Purulent sputum is most often caused by viral infections.  There are a small portion of those caused by non-viral agents being Mycoplamsa pneumonia.  Microscopic examination or C&S of sputum in the healthy adult with acute bronchitis is generally not helpful (usually negative or normal respiratory flora) other considerations being cough from upper respiratory tract infections, sinusitis or allergic syndromes (mild asthma or  viral pneumonia).  Differential Diagnosis:  reactive airway disease (asthma, allergic aspergillosis (eosinophilia), chronic bronchitis, respiratory infection (Sinusitis, Common cold, pneumonia), congestive heart failure, reflux esophagitis, bronchogenic tumor, aspiration syndromes and/or exposure irritants/tobacco smoke.  In this case, there is no evidence of any invasive bacterial illness.  Most likely viral etiology so will hold on antibiotic treatment.  Advise supportive care with rest, encourage fluids, good hygiene and watch for any worsening symptoms.  If they were to develop:  come back to the office or go to the emergency room if after hours. Without high fever, severe  dyspnea, lack of physical findings or other risk factors, I will hold on a chest radiograph and CBC at this time.  I discussed that approximately 50% of patients with acute bronchitis have a cough that lasts up to three weeks, and 25% for over a month.  Tylenol, one to two tablets every four hours as needed for fever or myalgias.   No aspirin.  Patient instructed to follow up in one week or sooner if symptoms worsen. Patient verbalized agreement and understanding of treatment plan.  P2:  hand washing and cover cough

## 2016-05-31 ENCOUNTER — Ambulatory Visit: Payer: Self-pay | Admitting: Registered Nurse

## 2016-05-31 VITALS — BP 140/90 | HR 81 | Temp 97.5°F

## 2016-05-31 DIAGNOSIS — B9689 Other specified bacterial agents as the cause of diseases classified elsewhere: Secondary | ICD-10-CM

## 2016-05-31 DIAGNOSIS — J209 Acute bronchitis, unspecified: Secondary | ICD-10-CM

## 2016-05-31 DIAGNOSIS — J029 Acute pharyngitis, unspecified: Secondary | ICD-10-CM

## 2016-05-31 DIAGNOSIS — H6593 Unspecified nonsuppurative otitis media, bilateral: Secondary | ICD-10-CM

## 2016-05-31 DIAGNOSIS — J019 Acute sinusitis, unspecified: Principal | ICD-10-CM

## 2016-05-31 MED ORDER — AZITHROMYCIN 250 MG PO TABS: ORAL_TABLET | ORAL | 0 refills | Status: DC

## 2016-05-31 NOTE — Patient Instructions (Addendum)
May stop prednisone Start azithromycin 500mg  (two tabs today) then 1 tab by mouth once a day starting tomorrow for next 4 days Continue all of your allergy medications e.g. flonase, nasal saline, xyzal and singulair Hydrate  Honey with lemon Tylenol 1000mg  by mouth every 6 hours as needed for pain Motrin 800mg  by mouth every 8 hours as needed for pain take with food Shower twice a day Follow up re-evaluation this weekend Urgent Care/ER/PCM if shortness of breath, coughing up blood, lethargy, ear discharge/bleeding Pharyngitis Pharyngitis is redness, pain, and swelling (inflammation) of your pharynx. What are the causes? Pharyngitis is usually caused by infection. Most of the time, these infections are from viruses (viral) and are part of a cold. However, sometimes pharyngitis is caused by bacteria (bacterial). Pharyngitis can also be caused by allergies. Viral pharyngitis may be spread from person to person by coughing, sneezing, and personal items or utensils (cups, forks, spoons, toothbrushes). Bacterial pharyngitis may be spread from person to person by more intimate contact, such as kissing. What are the signs or symptoms? Symptoms of pharyngitis include:  Sore throat.  Tiredness (fatigue).  Low-grade fever.  Headache.  Joint pain and muscle aches.  Skin rashes.  Swollen lymph nodes.  Plaque-like film on throat or tonsils (often seen with bacterial pharyngitis). How is this diagnosed? Your health care provider will ask you questions about your illness and your symptoms. Your medical history, along with a physical exam, is often all that is needed to diagnose pharyngitis. Sometimes, a rapid strep test is done. Other lab tests may also be done, depending on the suspected cause. How is this treated? Viral pharyngitis will usually get better in 3-4 days without the use of medicine. Bacterial pharyngitis is treated with medicines that kill germs (antibiotics). Follow these  instructions at home:  Drink enough water and fluids to keep your urine clear or pale yellow.  Only take over-the-counter or prescription medicines as directed by your health care provider:  If you are prescribed antibiotics, make sure you finish them even if you start to feel better.  Do not take aspirin.  Get lots of rest.  Gargle with 8 oz of salt water ( tsp of salt per 1 qt of water) as often as every 1-2 hours to soothe your throat.  Throat lozenges (if you are not at risk for choking) or sprays may be used to soothe your throat. Contact a health care provider if:  You have large, tender lumps in your neck.  You have a rash.  You cough up green, yellow-brown, or bloody spit. Get help right away if:  Your neck becomes stiff.  You drool or are unable to swallow liquids.  You vomit or are unable to keep medicines or liquids down.  You have severe pain that does not go away with the use of recommended medicines.  You have trouble breathing (not caused by a stuffy nose). This information is not intended to replace advice given to you by your health care provider. Make sure you discuss any questions you have with your health care provider. Document Released: 02/26/2005 Document Revised: 08/04/2015 Document Reviewed: 11/03/2012 Elsevier Interactive Patient Education  2017 [employer]  Sinusitis, Adult Sinusitis is soreness and inflammation of your sinuses. Sinuses are hollow spaces in the bones around your face. Your sinuses are located:  Around your eyes.  In the middle of your forehead.  Behind your nose.  In your cheekbones. Your sinuses and nasal passages are lined with a stringy  fluid (mucus). Mucus normally drains out of your sinuses. When your nasal tissues become inflamed or swollen, the mucus can become trapped or blocked so air cannot flow through your sinuses. This allows bacteria, viruses, and funguses to grow, which leads to infection. Sinusitis can  develop quickly and last for 7?10 days (acute) or for more than 12 weeks (chronic). Sinusitis often develops after a cold. What are the causes? This condition is caused by anything that creates swelling in the sinuses or stops mucus from draining, including:  Allergies.  Asthma.  Bacterial or viral infection.  Abnormally shaped bones between the nasal passages.  Nasal growths that contain mucus (nasal polyps).  Narrow sinus openings.  Pollutants, such as chemicals or irritants in the air.  A foreign object stuck in the nose.  A fungal infection. This is rare. What increases the risk? The following factors may make you more likely to develop this condition:  Having allergies or asthma.  Having had a recent cold or respiratory tract infection.  Having structural deformities or blockages in your nose or sinuses.  Having a weak immune system.  Doing a lot of swimming or diving.  Overusing nasal sprays.  Smoking. What are the signs or symptoms? The main symptoms of this condition are pain and a feeling of pressure around the affected sinuses. Other symptoms include:  Upper toothache.  Earache.  Headache.  Bad breath.  Decreased sense of smell and taste.  A cough that may get worse at night.  Fatigue.  Fever.  Thick drainage from your nose. The drainage is often green and it may contain pus (purulent).  Stuffy nose or congestion.  Postnasal drip. This is when extra mucus collects in the throat or back of the nose.  Swelling and warmth over the affected sinuses.  Sore throat.  Sensitivity to light. How is this diagnosed? This condition is diagnosed based on symptoms, a medical history, and a physical exam. To find out if your condition is acute or chronic, your health care provider may:  Look in your nose for signs of nasal polyps.  Tap over the affected sinus to check for signs of infection.  View the inside of your sinuses using an imaging device  that has a light attached (endoscope). If your health care provider suspects that you have chronic sinusitis, you may also:  Be tested for allergies.  Have a sample of mucus taken from your nose (nasal culture) and checked for bacteria.  Have a mucus sample examined to see if your sinusitis is related to an allergy. If your sinusitis does not respond to treatment and it lasts longer than 8 weeks, you may have an MRI or CT scan to check your sinuses. These scans also help to determine how severe your infection is. In rare cases, a bone biopsy may be done to rule out more serious types of fungal sinus disease. How is this treated? Treatment for sinusitis depends on the cause and whether your condition is chronic or acute. If a virus is causing your sinusitis, your symptoms will go away on their own within 10 days. You may be given medicines to relieve your symptoms, including:  Topical nasal decongestants. They shrink swollen nasal passages and let mucus drain from your sinuses.  Antihistamines. These drugs block inflammation that is triggered by allergies. This can help to ease swelling in your nose and sinuses.  Topical nasal corticosteroids. These are nasal sprays that ease inflammation and swelling in your nose and sinuses.  Nasal saline washes. These rinses can help to get rid of thick mucus in your nose. If your condition is caused by bacteria, you will be given an antibiotic medicine. If your condition is caused by a fungus, you will be given an antifungal medicine. Surgery may be needed to correct underlying conditions, such as narrow nasal passages. Surgery may also be needed to remove polyps. Follow these instructions at home: Medicines   Take, use, or apply over-the-counter and prescription medicines only as told by your health care provider. These may include nasal sprays.  If you were prescribed an antibiotic medicine, take it as told by your health care provider. Do not stop  taking the antibiotic even if you start to feel better. Hydrate and Humidify   Drink enough water to keep your urine clear or pale yellow. Staying hydrated will help to thin your mucus.  Use a cool mist humidifier to keep the humidity level in your home above 50%.  Inhale steam for 10-15 minutes, 3-4 times a day or as told by your health care provider. You can do this in the bathroom while a hot shower is running.  Limit your exposure to cool or dry air. Rest   Rest as much as possible.  Sleep with your head raised (elevated).  Make sure to get enough sleep each night. General instructions   Apply a warm, moist washcloth to your face 3-4 times a day or as told by your health care provider. This will help with discomfort.  Wash your hands often with soap and water to reduce your exposure to viruses and other germs. If soap and water are not available, use hand sanitizer.  Do not smoke. Avoid being around people who are smoking (secondhand smoke).  Keep all follow-up visits as told by your health care provider. This is important. Contact a health care provider if:  You have a fever.  Your symptoms get worse.  Your symptoms do not improve within 10 days. Get help right away if:  You have a severe headache.  You have persistent vomiting.  You have pain or swelling around your face or eyes.  You have vision problems.  You develop confusion.  Your neck is stiff.  You have trouble breathing. This information is not intended to replace advice given to you by your health care provider. Make sure you discuss any questions you have with your health care provider. Document Released: 02/26/2005 Document Revised: 10/23/2015 Document Reviewed: 12/22/2014   Laryngitis Laryngitis is inflammation of your vocal cords. This causes hoarseness, coughing, loss of voice, sore throat, or a dry throat. Your vocal cords are two bands of muscles that are found in your throat. When you speak,  these cords come together and vibrate. These vibrations come out through your mouth as sound. When your vocal cords are inflamed, your voice sounds different. Laryngitis can be temporary (acute) or long-term (chronic). Most cases of acute laryngitis improve with time. Chronic laryngitis is laryngitis that lasts for more than three weeks. What are the causes? Acute laryngitis may be caused by:  A viral infection.  Lots of talking, yelling, or singing. This is also called vocal strain.  Bacterial infections. Chronic laryngitis may be caused by:  Vocal strain.  Injury to your vocal cords.  Acid reflux (gastroesophageal reflux disease or GERD).  Allergies.  Sinus infection.  Smoking.  Alcohol abuse.  Breathing in chemicals or dust.  Growths on the vocal cords. What increases the risk? Risk factors for laryngitis  include:  Smoking.  Alcohol abuse.  Having allergies. What are the signs or symptoms? Symptoms of laryngitis may include:  Low, hoarse voice.  Loss of voice.  Dry cough.  Sore throat.  Stuffy nose. How is this diagnosed? Laryngitis may be diagnosed by:  Physical exam.  Throat culture.  Blood test.  Laryngoscopy. This procedure allows your health care provider to look at your vocal cords with a mirror or viewing tube. How is this treated? Treatment for laryngitis depends on what is causing it. Usually, treatment involves resting your voice and using medicines to soothe your throat. However, if your laryngitis is caused by a bacterial infection, you may need to take antibiotic medicine. If your laryngitis is caused by a growth, you may need to have a procedure to remove it. Follow these instructions at home:  Drink enough fluid to keep your urine clear or pale yellow.  Breathe in moist air. Use a humidifier if you live in a dry climate.  Take medicines only as directed by your health care provider.  If you were prescribed an antibiotic medicine,  finish it all even if you start to feel better.  Do not smoke cigarettes or electronic cigarettes. If you need help quitting, ask your health care provider.  Talk as little as possible. Also avoid whispering, which can cause vocal strain.  Write instead of talking. Do this until your voice is back to normal. Contact a health care provider if:  You have a fever.  You have increasing pain.  You have difficulty swallowing. Get help right away if:  You cough up blood.  You have trouble breathing. This information is not intended to replace advice given to you by your health care provider. Make sure you discuss any questions you have with your health care provider. Document Released: 02/26/2005 Document Revised: 08/04/2015 Document Reviewed: 08/11/2013 Elsevier Interactive Patient Education  2017 [employer] Inc[employer] Pharyngitis Pharyngitis is redness, pain, and swelling (inflammation) of your pharynx. What are the causes? Pharyngitis is usually caused by infection. Most of the time, these infections are from viruses (viral) and are part of a cold. However, sometimes pharyngitis is caused by bacteria (bacterial). Pharyngitis can also be caused by allergies. Viral pharyngitis may be spread from person to person by coughing, sneezing, and personal items or utensils (cups, forks, spoons, toothbrushes). Bacterial pharyngitis may be spread from person to person by more intimate contact, such as kissing. What are the signs or symptoms? Symptoms of pharyngitis include:  Sore throat.  Tiredness (fatigue).  Low-grade fever.  Headache.  Joint pain and muscle aches.  Skin rashes.  Swollen lymph nodes.  Plaque-like film on throat or tonsils (often seen with bacterial pharyngitis). How is this diagnosed? Your health care provider will ask you questions about your illness and your symptoms. Your medical history, along with a physical exam, is often all that is needed to diagnose pharyngitis.  Sometimes, a rapid strep test is done. Other lab tests may also be done, depending on the suspected cause. How is this treated? Viral pharyngitis will usually get better in 3-4 days without the use of medicine. Bacterial pharyngitis is treated with medicines that kill germs (antibiotics). Follow these instructions at home:  Drink enough water and fluids to keep your urine clear or pale yellow.  Only take over-the-counter or prescription medicines as directed by your health care provider:  If you are prescribed antibiotics, make sure you finish them even if you start to feel better.  Do not take  aspirin.  Get lots of rest.  Gargle with 8 oz of salt water ( tsp of salt per 1 qt of water) as often as every 1-2 hours to soothe your throat.  Throat lozenges (if you are not at risk for choking) or sprays may be used to soothe your throat. Contact a health care provider if:  You have large, tender lumps in your neck.  You have a rash.  You cough up green, yellow-brown, or bloody spit. Get help right away if:  Your neck becomes stiff.  You drool or are unable to swallow liquids.  You vomit or are unable to keep medicines or liquids down.  You have severe pain that does not go away with the use of recommended medicines.  You have trouble breathing (not caused by a stuffy nose). This information is not intended to replace advice given to you by your health care provider. Make sure you discuss any questions you have with your health care provider. Document Released: 02/26/2005 Document Revised: 08/04/2015 Document Reviewed: 11/03/2012 Elsevier Interactive Patient Education  2017 [employer]  Acute Bronchitis, Adult Acute bronchitis is sudden (acute) swelling of the air tubes (bronchi) in the lungs. Acute bronchitis causes these tubes to fill with mucus, which can make it hard to breathe. It can also cause coughing or wheezing. In adults, acute bronchitis usually goes away within 2  weeks. A cough caused by bronchitis may last up to 3 weeks. Smoking, allergies, and asthma can make the condition worse. Repeated episodes of bronchitis may cause further lung problems, such as chronic obstructive pulmonary disease (COPD). What are the causes? This condition can be caused by germs and by substances that irritate the lungs, including:  Cold and flu viruses. This condition is most often caused by the same virus that causes a cold.  Bacteria.  Exposure to tobacco smoke, dust, fumes, and air pollution. What increases the risk? This condition is more likely to develop in people who:  Have close contact with someone with acute bronchitis.  Are exposed to lung irritants, such as tobacco smoke, dust, fumes, and vapors.  Have a weak immune system.  Have a respiratory condition such as asthma. What are the signs or symptoms? Symptoms of this condition include:  A cough.  Coughing up clear, yellow, or green mucus.  Wheezing.  Chest congestion.  Shortness of breath.  A fever.  Body aches.  Chills.  A sore throat. How is this diagnosed? This condition is usually diagnosed with a physical exam. During the exam, your health care provider may order tests, such as chest X-rays, to rule out other conditions. He or she may also:  Test a sample of your mucus for bacterial infection.  Check the level of oxygen in your blood. This is done to check for pneumonia.  Do a chest X-ray or lung function testing to rule out pneumonia and other conditions.  Perform blood tests. Your health care provider will also ask about your symptoms and medical history. How is this treated? Most cases of acute bronchitis clear up over time without treatment. Your health care provider may recommend:  Drinking more fluids. Drinking more makes your mucus thinner, which may make it easier to breathe.  Taking a medicine for a fever or cough.  Taking an antibiotic medicine.  Using an  inhaler to help improve shortness of breath and to control a cough.  Using a cool mist vaporizer or humidifier to make it easier to breathe. Follow these instructions at  home: Medicines   Take over-the-counter and prescription medicines only as told by your health care provider.  If you were prescribed an antibiotic, take it as told by your health care provider. Do not stop taking the antibiotic even if you start to feel better. General instructions   Get plenty of rest.  Drink enough fluids to keep your urine clear or pale yellow.  Avoid smoking and secondhand smoke. Exposure to cigarette smoke or irritating chemicals will make bronchitis worse. If you smoke and you need help quitting, ask your health care provider. Quitting smoking will help your lungs heal faster.  Use an inhaler, cool mist vaporizer, or humidifier as told by your health care provider.  Keep all follow-up visits as told by your health care provider. This is important. How is this prevented? To lower your risk of getting this condition again:  Wash your hands often with soap and water. If soap and water are not available, use hand sanitizer.  Avoid contact with people who have cold symptoms.  Try not to touch your hands to your mouth, nose, or eyes.  Make sure to get the flu shot every year. Contact a health care provider if:  Your symptoms do not improve in 2 weeks of treatment. Get help right away if:  You cough up blood.  You have chest pain.  You have severe shortness of breath.  You become dehydrated.  You faint or keep feeling like you are going to faint.  You keep vomiting.  You have a severe headache.  Your fever or chills gets worse. This information is not intended to replace advice given to you by your health care provider. Make sure you discuss any questions you have with your health care provider. Document Released: 04/05/2004 Document Revised: 09/21/2015 Document Reviewed:  08/17/2015 Elsevier Interactive Patient Education  2017 [employer]  [employer] Interactive Patient Education  [employer]

## 2016-05-31 NOTE — Progress Notes (Signed)
Subjective:    Patient ID: Carol Murphy, female    DOB: [birth year], 66 y.o.   MRN: [id number]  65y/o single caucasian female RTC due to no significant improvement over last 48 hours. Has had 2 doses of 20mg  Prednisone. Using Flonase and nasal saline spray. +fatigue went to bed early last night. Voice change, deep, raspy, still present sinus pressure has worsened energy level improved at work.  Denied wheezing/shortness of breath/hemoptysis/fever/chills.       Review of Systems  Constitutional: Positive for activity change and fatigue. Negative for appetite change, chills, diaphoresis, fever and unexpected weight change.  HENT: Positive for congestion, ear pain, postnasal drip, sinus pain, sinus pressure, sore throat and voice change. Negative for dental problem, drooling, ear discharge, facial swelling, hearing loss, mouth sores, nosebleeds, rhinorrhea, sneezing, tinnitus and trouble swallowing.   Eyes: Negative for photophobia, pain, discharge, redness, itching and visual disturbance.  Respiratory: Positive for cough. Negative for choking, chest tightness, shortness of breath, wheezing and stridor.   Cardiovascular: Negative for chest pain, palpitations and leg swelling.  Gastrointestinal: Negative for abdominal distention, abdominal pain, blood in stool, constipation, diarrhea, nausea and vomiting.  Endocrine: Negative for cold intolerance and heat intolerance.  Genitourinary: Negative for difficulty urinating, dysuria and hematuria.  Musculoskeletal: Negative for arthralgias, back pain, gait problem, joint swelling, myalgias, neck pain and neck stiffness.  Skin: Negative for color change, pallor, rash and wound.  Allergic/Immunologic: Positive for environmental allergies. Negative for food allergies.  Neurological: Positive for headaches. Negative for dizziness, tremors, seizures, syncope, facial asymmetry, speech difficulty, weakness, light-headedness and numbness.  Hematological:  Negative for adenopathy. Does not bruise/bleed easily.  Psychiatric/Behavioral: Negative for agitation, behavioral problems, confusion and sleep disturbance.       Objective:   Physical Exam  Constitutional: She is oriented to person, place, and time. Vital signs are normal. She appears well-developed and well-nourished. She is active and cooperative.  Non-toxic appearance. She does not have a sickly appearance. She appears ill. No distress.  HENT:  Head: Normocephalic and atraumatic.  Right Ear: Hearing, external ear and ear canal normal. Tympanic membrane is injected, erythematous and bulging. A middle ear effusion is present.  Left Ear: Hearing, external ear and ear canal normal. A middle ear effusion is present.  Nose: Mucosal edema and rhinorrhea present. No nose lacerations, sinus tenderness, nasal deformity, septal deviation or nasal septal hematoma. No epistaxis.  No foreign bodies. Right sinus exhibits maxillary sinus tenderness and frontal sinus tenderness. Left sinus exhibits maxillary sinus tenderness and frontal sinus tenderness.  Mouth/Throat: Uvula is midline and mucous membranes are normal. Mucous membranes are not pale, not dry and not cyanotic. She does not have dentures. No oral lesions. No trismus in the jaw. Normal dentition. No dental abscesses, uvula swelling, lacerations or dental caries. Posterior oropharyngeal edema and posterior oropharyngeal erythema present. No oropharyngeal exudate or tonsillar abscesses.  Worsening erythema oropharynx; cobblestoning no exudate; bilateral TMs air fluid level clear; right TM with some erythema 9 to 12 oclock; bilateral allergic shiners; hoarse voice; bilateral nasal turbinates edema/erythema; bilateral maxilary sinuses ttp greater than frontal sinuses TTP  Eyes: Conjunctivae, EOM and lids are normal. Pupils are equal, round, and reactive to light. Right eye exhibits no chemosis, no discharge, no exudate and no hordeolum. No foreign body  present in the right eye. Left eye exhibits no chemosis, no discharge, no exudate and no hordeolum. No foreign body present in the left eye. Right conjunctiva is not injected. Right conjunctiva has no hemorrhage.  Left conjunctiva is not injected. Left conjunctiva has no hemorrhage. No scleral icterus. Right eye exhibits normal extraocular motion and no nystagmus. Left eye exhibits normal extraocular motion and no nystagmus. Right pupil is round and reactive. Left pupil is round and reactive. Pupils are equal.  Neck: Trachea normal and normal range of motion. Neck supple. No tracheal tenderness, no spinous process tenderness and no muscular tenderness present. No neck rigidity. No tracheal deviation, no edema, no erythema and normal range of motion present. No thyroid mass and no thyromegaly present.  Cardiovascular: Normal rate, regular rhythm, S1 normal, S2 normal, normal heart sounds and intact distal pulses.  PMI is not displaced.  Exam reveals no gallop and no friction rub.   No murmur heard. Pulmonary/Chest: Effort normal and breath sounds normal. No accessory muscle usage or stridor. No respiratory distress. She has no decreased breath sounds. She has no wheezes. She has no rhonchi. She has no rales. She exhibits no tenderness.  Speaks full sentences without difficulty; no cough observed in exam room  Abdominal: Soft. She exhibits no distension.  Musculoskeletal: Normal range of motion. She exhibits no edema or tenderness.       Right shoulder: Normal.       Left shoulder: Normal.       Right hip: Normal.       Left hip: Normal.       Right knee: Normal.       Left knee: Normal.       Cervical back: Normal.       Right hand: Normal.       Left hand: Normal.  Lymphadenopathy:       Head (right side): No submental, no submandibular, no tonsillar, no preauricular, no posterior auricular and no occipital adenopathy present.       Head (left side): No submental, no submandibular, no tonsillar, no  preauricular, no posterior auricular and no occipital adenopathy present.    She has no cervical adenopathy.       Right cervical: No superficial cervical, no deep cervical and no posterior cervical adenopathy present.      Left cervical: No superficial cervical, no deep cervical and no posterior cervical adenopathy present.  Neurological: She is alert and oriented to person, place, and time. She has normal strength. She is not disoriented. She displays no atrophy and no tremor. No cranial nerve deficit or sensory deficit. She exhibits normal muscle tone. She displays no seizure activity. Coordination and gait normal. GCS eye subscore is 4. GCS verbal subscore is 5. GCS motor subscore is 6.  Skin: Skin is warm, dry and intact. No abrasion, no bruising, no burn, no ecchymosis, no laceration, no lesion, no petechiae and no rash noted. She is not diaphoretic. No cyanosis or erythema. No pallor. Nails show no clubbing.  Psychiatric: She has a normal mood and affect. Her speech is normal and behavior is normal. Judgment and thought content normal. Cognition and memory are normal.  Nursing note and vitals reviewed.         Assessment & Plan:  A-acute bacterial rhinosinusitis; acute bronchitis; acute otitis media effusion bilaterally, acute pharyngitis  P- Continue flonase 1 spray each nostril BID #1 RF0 Rx given from [facility], saline 2 sprays each nostril q2h prn congestion given 1 UD bottle from clinic stock.  Has not had improvement with prednisone 20mg  po daily--cannot tolerate higher dose.  Has tolerated azithromycin in the past dispensed from [facility] azithromycin 500mg  po today then 250mg  po daily days  2-5 #60 RF0.  Patient unable to tolerate doxycycline and keflex due to GI upset/diarrhea.  Allergic to penidillins.    Patient has motrin 800mg  po TID prn pain previously dispensed from [facility] at home may continue prn with food.  May stop or continue prednisone 20mg  po if she feels is helping until course  completed 10 days.  No evidence of systemic bacterial infection, non toxic and well hydrated.  I do not see where any further testing or imaging is necessary at this time.   I will suggest supportive care, rest, good hygiene and encourage the patient to take adequate fluids.  The patient is to return to clinic or EMERGENCY ROOM if symptoms worsen or change significantly.  Exitcare handout on sinusitis given to patient.  Patient verbalized agreement and understanding of treatment plan and had no further questions at this time.   P2:  Hand washing and cover cough  Usually no specific medical treatment is needed if a virus is causing the sore throat.  The throat most often gets better on its own within 5 to 7 days.  Antibiotic medicine does not cure viral pharyngitis.   For acute pharyngitis caused by bacteria, your healthcare provider will prescribe an antibiotic.  Marland Kitchen Do not smoke.  Marland Kitchen Avoid secondhand smoke and other air pollutants.  . Use a cool mist humidifier to add moisture to the air.  . Get plenty of rest.  . You may want to rest your throat by talking less and eating a diet that is mostly liquid or soft for a day or two.   Marland Kitchen Nonprescription throat lozenges and mouthwashes should help relieve the soreness.   . Gargling with warm saltwater and drinking warm liquids may help.  (You can make a saltwater solution by adding 1/4 teaspoon of salt to 8 ounces, or 240 mL, of warm water.)  . A nonprescription pain reliever such as aspirin, acetaminophen, or ibuprofen may ease general aches and pains.   FOLLOW UP with clinic provider if no improvements in the next 7-10 days.  Patient verbalized understanding of instructions and agreed with plan of care. P2:  Hand washing and diet.  Bronchitis simple, community acquired, may have started as viral (probably respiratory syncytial, parainfluenza, influenza, or adenovirus), but now evidence of acute purulent bronchitis with resultant bronchial edema and mucus  formation.  Differential Diagnoses:  Reactive Airway Disease (asthma, allergic aspergillosis eosinophilia), chronic bronchitis, respiratory infection (sinusitis, common cold, pneumonia), congestive heart failure, smoke/irritant exposure, reflux esophagitis, bronchogenic tumor, and/or aspiration syndromes.  I will give Zithromax for five days for possible Mycoplasma.  Without high fever, severe dyspnea and lack of physical findings or risk factors will hold on chest radiograph and CBC at this time.  I discussed that approximately 50% of patients with acute bronchitis have a cough that lasts up to three weeks, and 25% for over a month. Tylenol, one to two tablets every four hours as needed for fever or myalgias.   No aspirin. Patient instructed to follow up in one week or sooner if symptoms worsen. Patient verbalized agreement and understanding of treatment plan.  P2:  hand washing and cover cough  Supportive treatment.   No evidence of invasive bacterial infection, non toxic and well hydrated.  This is most likely self limiting viral infection.  I do not see where any further testing or imaging is necessary at this time.   I will suggest supportive care, rest, good hygiene and encourage the patient to take adequate fluids.  The patient is to return to clinic or EMERGENCY ROOM if symptoms worsen or change significantly e.g. ear pain, fever, purulent discharge from ears or bleeding.  Exitcare handout on otitis media with effusion given to patient.  Patient verbalized agreement and understanding of treatment plan.

## 2016-06-14 ENCOUNTER — Encounter: Payer: Self-pay | Admitting: *Deleted

## 2016-06-14 ENCOUNTER — Other Ambulatory Visit: Payer: Self-pay | Admitting: Medical

## 2016-06-14 ENCOUNTER — Other Ambulatory Visit: Payer: Self-pay | Admitting: *Deleted

## 2016-06-14 MED ORDER — LEVOCETIRIZINE DIHYDROCHLORIDE 5 MG PO TABS: 5.0000 mg | ORAL_TABLET | Freq: Every evening | ORAL | 0 refills | Status: DC

## 2016-06-14 NOTE — Telephone Encounter (Signed)
[facility] refill electronic Rx for Xyzal  po daily #90 RF0 for patient to use flex spending account completed. Patient reported singulair and xyzal working well to control her allergies. Patient to follow up with PCM.

## 2016-06-14 NOTE — Telephone Encounter (Signed)
Request refill Levocetirizine . [facility] at [address]

## 2016-06-18 ENCOUNTER — Other Ambulatory Visit: Payer: Self-pay | Admitting: Medical

## 2016-06-19 ENCOUNTER — Other Ambulatory Visit: Payer: Self-pay | Admitting: Medical

## 2016-06-19 MED ORDER — LEVOCETIRIZINE DIHYDROCHLORIDE 5 MG PO TABS: 5.0000 mg | ORAL_TABLET | Freq: Every evening | ORAL | 0 refills | Status: AC

## 2016-06-19 NOTE — Telephone Encounter (Signed)
Refill sent.

## 2016-06-21 ENCOUNTER — Other Ambulatory Visit: Payer: Self-pay | Admitting: *Deleted

## 2016-06-21 DIAGNOSIS — Z0000 Encounter for general adult medical examination without abnormal findings: Secondary | ICD-10-CM

## 2016-06-21 DIAGNOSIS — E538 Deficiency of other specified B group vitamins: Secondary | ICD-10-CM

## 2016-06-21 NOTE — Progress Notes (Signed)
Labs drawn per PCP order r/t annual physical. Will plan to use these results for upcoming Be Well program eligibility for work insurance as well.

## 2016-06-22 LAB — CMP12+LP+TP+TSH+6AC+CBC/D/PLT
ALBUMIN: 4.2 g/dL (ref 3.6–4.8)
ALK PHOS: 57 IU/L (ref 39–117)
ALT: 10 IU/L (ref 0–32)
AST: 18 IU/L (ref 0–40)
Albumin/Globulin Ratio: 1.6 (ref 1.2–2.2)
BASOS ABS: 0 10*3/uL (ref 0.0–0.2)
BUN/Creatinine Ratio: 21 (ref 12–28)
BUN: 17 mg/dL (ref 8–27)
Basos: 0 %
Bilirubin Total: 0.3 mg/dL (ref 0.0–1.2)
CALCIUM: 8.7 mg/dL (ref 8.7–10.3)
Chloride: 103 mmol/L (ref 96–106)
Chol/HDL Ratio: 2.5 ratio (ref 0.0–4.4)
Cholesterol, Total: 147 mg/dL (ref 100–199)
Creatinine, Ser: 0.81 mg/dL (ref 0.57–1.00)
EOS (ABSOLUTE): 0.1 10*3/uL (ref 0.0–0.4)
Eos: 2 %
FREE THYROXINE INDEX: 1.5 (ref 1.2–4.9)
GFR, EST AFRICAN AMERICAN: 87 mL/min/{1.73_m2} (ref >59)
GFR, EST NON AFRICAN AMERICAN: 75 mL/min/{1.73_m2} (ref >59)
GGT: 15 IU/L (ref 0–60)
Globulin, Total: 2.6 g/dL (ref 1.5–4.5)
Glucose: 79 mg/dL (ref 65–99)
HDL: 58 mg/dL (ref >39)
Hematocrit: 41.9 % (ref 34.0–46.6)
Hemoglobin: 13.7 g/dL (ref 11.1–15.9)
Immature Grans (Abs): 0 10*3/uL (ref 0.0–0.1)
Immature Granulocytes: 0 %
Iron: 74 ug/dL (ref 27–139)
LDH: 228 IU/L — ABNORMAL HIGH (ref 119–226)
LDL Calculated: 72 mg/dL (ref 0–99)
LYMPHS: 26 %
Lymphocytes Absolute: 1.5 10*3/uL (ref 0.7–3.1)
MCH: 29.6 pg (ref 26.6–33.0)
MCHC: 32.7 g/dL (ref 31.5–35.7)
MCV: 91 fL (ref 79–97)
MONOS ABS: 0.4 10*3/uL (ref 0.1–0.9)
Monocytes: 7 %
NEUTROS ABS: 3.7 10*3/uL (ref 1.4–7.0)
NEUTROS PCT: 65 %
POTASSIUM: 4.5 mmol/L (ref 3.5–5.2)
Phosphorus: 4.3 mg/dL (ref 2.5–4.5)
Platelets: 278 10*3/uL (ref 150–379)
RBC: 4.63 x10E6/uL (ref 3.77–5.28)
RDW: 13.6 % (ref 12.3–15.4)
Sodium: 143 mmol/L (ref 134–144)
T3 Uptake Ratio: 24 % (ref 24–39)
T4, Total: 6.2 ug/dL (ref 4.5–12.0)
TSH: 1.19 u[IU]/mL (ref 0.450–4.500)
Total Protein: 6.8 g/dL (ref 6.0–8.5)
Triglycerides: 83 mg/dL (ref 0–149)
Uric Acid: 3.5 mg/dL (ref 2.5–7.1)
VLDL Cholesterol Cal: 17 mg/dL (ref 5–40)
WBC: 5.8 10*3/uL (ref 3.4–10.8)

## 2016-06-22 LAB — HGB A1C W/O EAG: HEMOGLOBIN A1C: 5.5 % (ref 4.8–5.6)

## 2016-06-22 LAB — VITAMIN B12: Vitamin B-12: 704 pg/mL (ref 232–1245)

## 2016-06-28 ENCOUNTER — Other Ambulatory Visit: Payer: Self-pay | Admitting: Medical

## 2016-06-28 ENCOUNTER — Ambulatory Visit (INDEPENDENT_AMBULATORY_CARE_PROVIDER_SITE_OTHER): Payer: PRIVATE HEALTH INSURANCE | Admitting: Medical

## 2016-06-28 ENCOUNTER — Encounter: Payer: Self-pay | Admitting: Medical

## 2016-06-28 ENCOUNTER — Other Ambulatory Visit: Payer: Self-pay

## 2016-06-28 VITALS — BP 132/80 | HR 70 | Ht 64.0 in | Wt 159.2 lb

## 2016-06-28 DIAGNOSIS — Z1231 Encounter for screening mammogram for malignant neoplasm of breast: Secondary | ICD-10-CM

## 2016-06-28 DIAGNOSIS — Z1239 Encounter for other screening for malignant neoplasm of breast: Secondary | ICD-10-CM

## 2016-06-28 DIAGNOSIS — K589 Irritable bowel syndrome without diarrhea: Secondary | ICD-10-CM

## 2016-06-28 DIAGNOSIS — J301 Allergic rhinitis due to pollen: Secondary | ICD-10-CM

## 2016-06-28 DIAGNOSIS — E2839 Other primary ovarian failure: Secondary | ICD-10-CM | POA: Diagnosis not present

## 2016-06-28 DIAGNOSIS — Z96612 Presence of left artificial shoulder joint: Secondary | ICD-10-CM | POA: Diagnosis not present

## 2016-06-28 DIAGNOSIS — Z96651 Presence of right artificial knee joint: Secondary | ICD-10-CM

## 2016-06-28 DIAGNOSIS — K219 Gastro-esophageal reflux disease without esophagitis: Secondary | ICD-10-CM | POA: Diagnosis not present

## 2016-06-28 DIAGNOSIS — Z0000 Encounter for general adult medical examination without abnormal findings: Secondary | ICD-10-CM

## 2016-06-28 DIAGNOSIS — Z79899 Other long term (current) drug therapy: Secondary | ICD-10-CM

## 2016-06-28 DIAGNOSIS — M19012 Primary osteoarthritis, left shoulder: Secondary | ICD-10-CM | POA: Diagnosis not present

## 2016-06-28 DIAGNOSIS — M8580 Other specified disorders of bone density and structure, unspecified site: Secondary | ICD-10-CM | POA: Diagnosis not present

## 2016-06-28 DIAGNOSIS — Z7189 Other specified counseling: Secondary | ICD-10-CM | POA: Diagnosis not present

## 2016-06-28 DIAGNOSIS — E7800 Pure hypercholesterolemia, unspecified: Secondary | ICD-10-CM

## 2016-06-28 DIAGNOSIS — Z7185 Encounter for immunization safety counseling: Secondary | ICD-10-CM

## 2016-06-28 LAB — POCT URINALYSIS DIPSTICK
BILIRUBIN UA: NEGATIVE
Glucose, UA: NEGATIVE
Ketones, UA: NEGATIVE
LEUKOCYTES UA: NEGATIVE
Nitrite, UA: NEGATIVE
Protein, UA: NEGATIVE
Spec Grav, UA: 1.01 (ref 1.010–1.025)
UROBILINOGEN UA: NEGATIVE U/dL — AB
pH, UA: 6 (ref 5.0–8.0)

## 2016-06-28 MED ORDER — BUTALBITAL-APAP-CAFFEINE 50-325-40 MG PO TABS: ORAL_TABLET | ORAL | 2 refills | Status: DC

## 2016-06-28 MED ORDER — BUPROPION HCL ER (XL) 150 MG PO TB24: 150.0000 mg | ORAL_TABLET | Freq: Every day | ORAL | 3 refills | Status: AC

## 2016-06-28 MED ORDER — SIMVASTATIN 40 MG PO TABS: 40.0000 mg | ORAL_TABLET | Freq: Every day | ORAL | 3 refills | Status: AC

## 2016-06-28 MED ORDER — OMEPRAZOLE 20 MG PO CPDR: 20.0000 mg | DELAYED_RELEASE_CAPSULE | Freq: Every day | ORAL | 0 refills | Status: AC

## 2016-06-28 MED ORDER — BUPROPION HCL ER (XL) 150 MG PO TB24: 150.0000 mg | ORAL_TABLET | Freq: Every day | ORAL | 5 refills | Status: DC

## 2016-06-28 MED ORDER — MONTELUKAST SODIUM 10 MG PO TABS: 10.0000 mg | ORAL_TABLET | Freq: Every day | ORAL | 3 refills | Status: DC

## 2016-06-28 MED ORDER — IBUPROFEN 800 MG PO TABS: 800.0000 mg | ORAL_TABLET | Freq: Three times a day (TID) | ORAL | 1 refills | Status: AC | PRN

## 2016-06-28 NOTE — Patient Instructions (Addendum)
Recommendations:  scheduled mammogram and bone density scan  Follow up with dermatology today, colonoscopy as scheduled next month  Get your yearly flu shot  See your eye doctor yearly for routine vision care.  See your dentist yearly for routine dental care including hygiene visits twice yearly.  Eat a healthy low fat diet, get a variety of fruits and vegetables, don't eat meat every day, limit salt, and limit sweets.  Exercise regularly, preferably 40-60 minutes daily with aerobic exercise.  Get some weight bearing exercise a few times per week as well   I recommend you have a shingles vaccine to help prevent shingles or herpes zoster outbreak.   Please call your insurer to inquire about coverage for the Shingrix vaccine given in 2 doses.   Some insurers cover this vaccine after age 91, some cover this after age 10.  If your insurer covers this, then call to schedule appointment to have this vaccine here.  I recommend pneumococcal 23 as well.  Inquire with insurance.

## 2016-06-28 NOTE — Progress Notes (Addendum)
Subjective:   HPI  Carol Murphy is a 66 y.o. female who presents for a complete physical.  Chief Complaint  Patient presents with  . Annual Exam    physical , no other concerns    Medical team:  On site NP/PA at employment at [employer]  Ortho, Dr. Darcella Cheshire  Sees eye doctor and dentist  Sees dermatology every now and then  Dr. Suszanne Conners, ENT Aleen Campi, Jennye Runquist [facility], PA-C here for primary care  Concerns: She gets yearly labs free at work, results reviewed  Colonoscopy - has repeat scheduled in 07/2015  Sees dermatology today  Reviewed their medical, surgical, family, social, medication, and allergy history and updated chart as appropriate.  Past Medical History:  Diagnosis Date  . Allergic rhinitis, cause unspecified   . Anemia    low-normal B12, resolved on B12  . Diverticulosis   . DJD (degenerative joint disease)    low back, hips, knees, arthritis in thumbs  . Endometriosis    s/p TAH-BSO age 41  . Former smoker    quit 2006  . H/O bone density study 04/04/12, 2009   low bone mass, improved from 2009 study, consider repeat 3 years  . H/O echocardiogram 04/24/2006   normal study, EF>55%, mild tricuspid regurgitation; Dr. Jacinto Halim  . H/O mammogram 04/04/12   normal  . History of cardiovascular stress test 07/19/04   age 72yo, normal cardiolite study, Dr. Jacinto Halim  . IBS (irritable bowel syndrome)   . Osteopenia    DEXA '09 T-1.7 at hip ([facility])  . Pure hypercholesterolemia   . Unspecified vitamin D deficiency   . Wears glasses     Past Surgical History:  Procedure Laterality Date  . APPENDECTOMY  1970  . COLONOSCOPY  9/07, 11/2011   q5 years (Dr. Madilyn Fireman)  . ORIF WRIST FRACTURE  2001   right  . OSTEOTOMY     RLE, Dr. Thurston Hole  . TOTAL ABDOMINAL HYSTERECTOMY W/ BILATERAL SALPINGOOPHORECTOMY  age 82   endometriosis  . TOTAL KNEE ARTHROPLASTY  2000, 2004   Dr. Thurston Hole, then Dr. Sherlean Foot    Social History   Social History  . Marital status:  Significant Other    Spouse name: N/A  . Number of children: 0  . Years of education: N/A   Occupational History  . 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 Smoking status: Former Smoker    Packs/day: 1.00    Years: 25.00    Quit date: 03/12/2004  . Smokeless tobacco: Never Used  . Alcohol use Yes     Comment: 1-2 month  . Drug use: No  . Sexual activity: Not on file   Other Topics Concern  . Not on file   Social History Narrative   Has 34 year old daughter, and 2 Chihuahuas.  Exercise - walks most days;  Works at [employer].  Planning to retire fall 2018 move to [place] where her brother lives.  Wants to go on safari trip to [place] and sky dive again.    Family History  Problem Relation Age of Onset  . COPD Mother   . Colon polyps Mother   . Hypertension Father   . Hyperlipidemia Father   . Heart disease Father 66    died of MI  . Diabetes Father     diet controlled  . Cancer Paternal Grandmother     stomach  . Heart disease Paternal Aunt   . Heart disease Paternal Uncle   .  Stroke Neg Hx      Current Outpatient Prescriptions:  .  Calcium-Vitamin D 600-200 MG-UNIT per tablet, Take 1 tablet by mouth daily. A , Disp: , Rfl:  .  cyanocobalamin (,VITAMIN B-12,) 1000 MCG/ML injection, INJECT 1 ML (1,000 MCG TOTAL) INTO THE MUSCLE EVERY 21 ( TWENTY-ONE) DAYS., Disp: 30 mL, Rfl: 4 .  diclofenac sodium (VOLTAREN) 1 % GEL, Apply 2 g topically 4 (four) times daily., Disp: 100 g, Rfl: 2 .  dicyclomine (BENTYL) 20 MG tablet, Take 1 tablet (20 mg total) by mouth 3 (three) times daily before meals., Disp: 60 tablet, Rfl: 2 .  diphenhydramine-acetaminophen (TYLENOL PM) 25-500 MG TABS, Take 1 tablet by mouth at bedtime as needed. Reported on 05/06/2015, Disp: , Rfl:  .  fluticasone (FLONASE) 50 MCG/ACT nasal spray, Place 1 spray into both nostrils 2 (two) times daily., Disp: , Rfl:  .  ibuprofen (ADVIL,MOTRIN) 800 MG tablet, Take 1 tablet (800  mg total) by mouth every 8 (eight) hours as needed., Disp: 90 tablet, Rfl: 1 .  levocetirizine (XYZAL) 5 MG tablet, Take 1 tablet (5 mg total) by mouth every evening., Disp: 90 tablet, Rfl: 0 .  montelukast (SINGULAIR) 10 MG tablet, Take 1 tablet (10 mg total) by mouth at bedtime., Disp: 90 tablet, Rfl: 3 .  omeprazole (PRILOSEC) 20 MG capsule, Take 1 capsule (20 mg total) by mouth daily., Disp: 90 capsule, Rfl: 0 .  simvastatin (ZOCOR) 40 MG tablet, Take 1 tablet (40 mg total) by mouth daily., Disp: 90 tablet, Rfl: 3 .  buPROPion (WELLBUTRIN XL) 150 MG 24 hr tablet, Take 1 tablet (150 mg total) by mouth daily., Disp: 90 tablet, Rfl: 3 .  butalbital-acetaminophen-caffeine (ESGIC) 50-325-40 MG tablet, Take 1-2 tablets every 4 hours as needed for headache, Disp: 14 tablet, Rfl: 2  Allergies  Allergen Reactions  . Morphine And Related Other (See Comments)    hallucinations  . Penicillins Other (See Comments)    Gi upset    Review of Systems Constitutional: -fever, -chills, -sweats, -unexpected weight change, -decreased appetite, -fatigue Allergy: -sneezing, -itching, -congestion Dermatology: -changing moles, --rash, -lumps ENT: -runny nose, -ear pain, -sore throat, -hoarseness, -sinus pain, -teeth pain, - ringing in ears, -hearing loss, -nosebleeds Cardiology: -chest pain, -palpitations, -swelling, -difficulty breathing when lying flat, -waking up short of breath Respiratory: -cough, -shortness of breath, -difficulty breathing with exercise or exertion, -wheezing, -coughing up blood Gastroenterology: -abdominal pain, -nausea, -vomiting, -diarrhea, -constipation, -blood in stool, -changes in bowel movement, -difficulty swallowing or eating Hematology: -bleeding, -bruising  Musculoskeletal: +joint aches, -muscle aches, -joint swelling, -back pain, -neck pain, -cramping, -changes in gait Ophthalmology: denies vision changes, eye redness, itching, discharge Urology: -burning with urination,  -difficulty urinating, -blood in urine, -urinary frequency, -urgency, -incontinence Neurology: -headache, -weakness, -tingling, -numbness, -memory loss, -falls, -dizziness Psychology: depressed mood, -agitation, +sleep problems     Objective:   Physical Exam  BP 132/80   Pulse 70   Ht  (1.626 m)   Wt 159 lb 3.2 oz (72.2 kg)   SpO2 98%   BMI 27.33 kg/m   General appearance: alert, no distress, WD/WN, white female  Skin: scattered benign appearing lesions, no worrisome lesions, right posterolateral shoulder with 1cm diameter stuck on appearing brown uniform raised lesion, suggestive of seborrheic dermatosis  HEENT: normocephalic, conjunctiva/corneas normal, sclerae anicteric, PERRLA, EOMi, nares patent, no discharge or erythema, pharynx normal  Oral cavity: MMM, tongue normal, teeth in good repair  Neck: supple, no lymphadenopathy, no thyromegaly, no masses,  normal ROM, no bruits  Chest: non tender, normal shape and expansion  Heart: RRR, normal S1, S2, no murmurs  Lungs: CTA bilaterally, no wheezes, rhonchi, or rales  Abdomen: +bs, soft, +surgical scars noted central and RLQ abdomen, non tender, non distended, no masses, no hepatomegaly, no splenomegaly, no bruits  Back: non tender, normal ROM, no scoliosis  Musculoskeletal: surgical scar over posterior right wrist/hand, right knee anterior surgical scar, upper extremities non tender, no obvious deformity, normal ROM throughout, lower extremities non tender, no obvious deformity, normal ROM throughout  Extremities: no edema, no cyanosis, no clubbing  Pulses: 1+ pedal pulses,  otherwise 2+ symmetric, upper extremity pulses, normal cap refill  Neurological: alert, oriented x 3, CN2-12 intact, strength normal upper extremities and lower extremities, sensation normal throughout, DTRs 2+ throughout, no cerebellar signs, gait normal  Psychiatric: normal affect, behavior normal, pleasant  Breast/gyn/rectal deferred    Assessment and  Plan :    Encounter Diagnoses  Name Primary?  . Routine general medical examination at a health care facility Yes  . Estrogen deficiency   . High risk medication use   . Screening for breast cancer   . Seasonal allergic rhinitis due to pollen   . Gastroesophageal reflux disease without esophagitis   . Irritable bowel syndrome, unspecified type   . Osteopenia, unspecified location   . Primary osteoarthritis of left shoulder   . Vaccine counseling   . Status post total right knee replacement   . Status post replacement of left shoulder joint   . Pure hypercholesterolemia      Physical exam - discussed healthy lifestyle, diet, exercise, preventative care, vaccinations, and addressed their concerns.  Reviewed her recent comprehensive labs which were fine Gave order for mammogram and bone density scan F/u with dermatology today, colonoscopy scheduled next month She gets flu shot yearly at work  C/t OTC dose of omeprazole for GERD. Discussed risks of medication C/t Wellbutrin for depression, consider counseling. 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  See your dentist yearly for routine dental care including hygiene visits twice yearly.  Eat a healthy low fat diet, get a variety of fruits and vegetables, don't eat meat every day, limit salt, and limit sweets.  Exercise regularly, preferably 40-60 minutes daily with aerobic exercise.  Get some weight bearing exercise a few times per week as well   I recommend you have a shingles vaccine to help prevent shingles or herpes zoster outbreak.   Please call your insurer to inquire  about coverage for the Shingrix vaccine given in 2 doses.   Some insurers cover this vaccine after age 58, some cover this after age 83.  If your insurer covers this, then call to schedule appointment to have this vaccine here.  I recommend pneumococcal 23 as well.  She will inquire with insurance.  Sobia was seen today for annual exam.  Diagnoses and all orders for this visit:  Routine general medical examination at a health care facility -     Urinalysis Dipstick -     DG Bone Density; Future -     MM DIGITAL SCREENING  BILATERAL; Future -     Cancel: EKG 12-Lead  Estrogen deficiency -     DG Bone Density; Future  High risk medication use -     DG Bone Density; Future  Screening for breast cancer -     MM DIGITAL SCREENING BILATERAL; Future  Seasonal allergic rhinitis due to pollen  Gastroesophageal reflux disease without esophagitis  Irritable bowel syndrome, unspecified type  Osteopenia, unspecified location  Primary osteoarthritis of left shoulder  Vaccine counseling  Status post total right knee replacement  Status post replacement of left shoulder joint  Pure hypercholesterolemia -     Cancel: EKG 12-Lead  Other orders -     montelukast (SINGULAIR) 10 MG tablet; Take 1 tablet (10 mg total) by mouth at bedtime. -     buPROPion (WELLBUTRIN XL) 150 MG 24 hr tablet; Take 1 tablet (150 mg total) by mouth daily. -     omeprazole (PRILOSEC) 20 MG capsule; Take 1 capsule (20 mg total) by mouth daily. -     ibuprofen (ADVIL,MOTRIN) 800 MG tablet; Take 1 tablet (800 mg total) by mouth every 8 (eight) hours as needed. -     simvastatin (ZOCOR) 40 MG tablet; Take 1 tablet (40 mg total) by mouth daily.

## 2016-07-04 LAB — HM MAMMOGRAPHY

## 2016-07-04 LAB — HM DEXA SCAN

## 2016-07-09 ENCOUNTER — Encounter: Payer: Self-pay | Admitting: Medical

## 2016-07-09 ENCOUNTER — Telehealth: Payer: Self-pay | Admitting: Medical

## 2016-07-09 NOTE — Telephone Encounter (Signed)
Pt called [place] back ,returned her call and l/m about results of mammogram

## 2016-07-09 NOTE — Telephone Encounter (Signed)
I am happy to report that her mammogram was normal, no worrisome findings.

## 2016-07-09 NOTE — Telephone Encounter (Signed)
Called and l/m for pt call us back.  

## 2016-07-12 ENCOUNTER — Telehealth: Payer: Self-pay | Admitting: Medical

## 2016-07-12 NOTE — Telephone Encounter (Signed)
Called and l/m for pt call us back.  

## 2016-07-12 NOTE — Telephone Encounter (Signed)
Pt was notified of this 

## 2016-07-12 NOTE — Telephone Encounter (Signed)
Bone density scan shows osteopenia or reduced bone mass, but not quite osteoporosis.      The recommendation is to get 1200mg  calcium daily through 4 servings of dairy or OTC supplements, at least 1000 IU Vit D daily OTC, regular aerobic/cardio and weight bearing exercise  One other consideration is possibly using a medication like Fosamax or Boniva to reduce bone loss.  Before we would initiate this type of medication, I would want her to recheck to discuss this in detail.  So make f/u at her convenience

## 2016-07-13 ENCOUNTER — Encounter: Payer: Self-pay | Admitting: Medical

## 2016-07-19 LAB — HM COLONOSCOPY

## 2016-07-24 ENCOUNTER — Encounter: Payer: Self-pay | Admitting: Medical

## 2016-07-26 ENCOUNTER — Encounter: Payer: Self-pay | Admitting: Registered Nurse

## 2016-07-26 ENCOUNTER — Ambulatory Visit: Payer: Self-pay | Admitting: Registered Nurse

## 2016-07-26 VITALS — HR 70 | Resp 20

## 2016-07-26 DIAGNOSIS — L03116 Cellulitis of left lower limb: Secondary | ICD-10-CM

## 2016-07-26 MED ORDER — MUPIROCIN 2 % EX OINT: 1.0000 "application " | TOPICAL_OINTMENT | Freq: Two times a day (BID) | CUTANEOUS | 0 refills | Status: DC

## 2016-07-26 NOTE — Progress Notes (Addendum)
Subjective:    Patient ID: Carol Murphy, female    DOB: May 01, 1950, 66 y.o.   MRN: [id number]  65y/o single caucasian female here for evaluation of worsening rash lower leg/ankle.  Started out as dry skin and was improving with applying vaseline but worsened after she started rolling down sock and had colonoscopy.  Denied purulent discharge but area red, hot to touch, slightly tender/painful/swollen.  Denied fever, animal/insect bites, itchiness, SOB.      Review of Systems  Constitutional: Negative for activity change, appetite change, chills, diaphoresis, fatigue and fever.  HENT: Negative for congestion, ear pain and sore throat.   Eyes: Negative for pain and discharge.  Respiratory: Negative for cough and wheezing.   Cardiovascular: Negative for chest pain and leg swelling.  Gastrointestinal: Negative for blood in stool, constipation, diarrhea, nausea and vomiting.  Genitourinary: Negative for difficulty urinating and hematuria.  Musculoskeletal: Positive for myalgias. Negative for gait problem, joint swelling, neck pain and neck stiffness.  Skin: Positive for color change and rash. Negative for pallor and wound.  Allergic/Immunologic: Positive for environmental allergies. Negative for food allergies.  Neurological: Negative for headaches.  Hematological: Negative for adenopathy. Does not bruise/bleed easily.  Psychiatric/Behavioral: Negative for agitation, confusion and sleep disturbance. The patient is not nervous/anxious.        Objective:   Physical Exam  Constitutional: She is oriented to person, place, and time. Vital signs are normal. She appears well-developed and well-nourished. She is active and cooperative.  Non-toxic appearance. She does not have a sickly appearance. She does not appear ill. No distress.  HENT:  Head: Normocephalic and atraumatic.  Right Ear: Hearing and external ear normal.  Left Ear: Hearing and external ear normal.  Nose: Nose normal. No nose  lacerations, sinus tenderness, nasal deformity, septal deviation or nasal septal hematoma. No epistaxis.  No foreign bodies.  Mouth/Throat: Uvula is midline, oropharynx is clear and moist and mucous membranes are normal. Mucous membranes are not pale, not dry and not cyanotic. She does not have dentures. No oral lesions. No trismus in the jaw. Normal dentition. No dental abscesses, uvula swelling, lacerations or dental caries.  Eyes: Conjunctivae, EOM and lids are normal. Pupils are equal, round, and reactive to light. Right eye exhibits no chemosis, no discharge, no exudate and no hordeolum. No foreign body present in the right eye. Left eye exhibits no chemosis, no discharge, no exudate and no hordeolum. No foreign body present in the left eye. Right conjunctiva is not injected. Right conjunctiva has no hemorrhage. Left conjunctiva is not injected. Left conjunctiva has no hemorrhage. No scleral icterus. Right eye exhibits normal extraocular motion and no nystagmus. Left eye exhibits normal extraocular motion and no nystagmus. Right pupil is round and reactive. Left pupil is round and reactive. Pupils are equal.  Neck: Trachea normal, normal range of motion and phonation normal. Neck supple. No tracheal tenderness present. No neck rigidity. No tracheal deviation, no edema, no erythema and normal range of motion present. No thyroid mass and no thyromegaly present.  Cardiovascular: Normal rate, regular rhythm, S1 normal, S2 normal, normal heart sounds and intact distal pulses.  PMI is not displaced.  Exam reveals no gallop and no friction rub.   No murmur heard. Pulses:      Radial pulses are 2+ on the right side, and 2+ on the left side.  Pulmonary/Chest: Effort normal and breath sounds normal. No accessory muscle usage or stridor. No respiratory distress. She has no decreased breath sounds. She  has no wheezes. She has no rhonchi. She has no rales. She exhibits no tenderness.  Abdominal: Soft. She exhibits  no distension.  Musculoskeletal: Normal range of motion. She exhibits no edema.       Right shoulder: Normal.       Left shoulder: Normal.       Right hip: Normal.       Left hip: Normal.       Right knee: Normal.       Left knee: Normal.       Right ankle: Normal. She exhibits normal range of motion, no swelling, no ecchymosis, no deformity, no laceration and normal pulse. No tenderness. Achilles tendon normal.       Left ankle: She exhibits swelling. She exhibits normal range of motion, no ecchymosis, no deformity, no laceration and normal pulse. Tenderness. Achilles tendon normal.       Cervical back: Normal.       Right hand: Normal.       Left hand: Normal.       Right lower leg: Normal.       Left lower leg: Normal.       Feet:  Lymphadenopathy:       Head (right side): No submental, no submandibular, no tonsillar, no preauricular, no posterior auricular and no occipital adenopathy present.       Head (left side): No submental, no submandibular, no tonsillar, no preauricular, no posterior auricular and no occipital adenopathy present.    She has no cervical adenopathy.       Right cervical: No superficial cervical, no deep cervical and no posterior cervical adenopathy present.      Left cervical: No superficial cervical, no deep cervical and no posterior cervical adenopathy present.  Neurological: She is alert and oriented to person, place, and time. She has normal strength. She is not disoriented. She displays no atrophy and no tremor. No cranial nerve deficit or sensory deficit. She exhibits normal muscle tone. She displays no seizure activity. Coordination and gait normal. GCS eye subscore is 4. GCS verbal subscore is 5. GCS motor subscore is 6.  Gait sure and steady in clinic/hall  Skin: Skin is warm, dry and intact. Rash noted. No abrasion, no bruising, no burn, no ecchymosis, no laceration, no lesion, no petechiae and no purpura noted. Rash is macular. Rash is not papular, not  maculopapular, not nodular, not pustular, not vesicular and not urticarial. She is not diaphoretic. No cyanosis or erythema. No pallor. Nails show no clubbing.  2x4cm medial left lower extremity adjacent to ankle in sock line hot to touch, 0-1+/4 nonpitting edema dry macular erythema  Psychiatric: She has a normal mood and affect. Her speech is normal and behavior is normal. Judgment and thought content normal. Cognition and memory are normal.  Nursing note and vitals reviewed.         Assessment & Plan:  A-cellulitis leg  P- Patient refused oral antibiotics wants to trial topical as intolerance GI most antibiotics.  Given OTC triple antibiotic ointment 4 UD from clinic stock and Rx for mupirocin ointment apply BID x 10 days #1 Rf0 to affected area.  If worsening/spreading/purulent follow up for re-evaluation as will need to start oral antibiotics.  Discussed with patient pants rubbing on irritated area when she folded down socks probable cause if not itching area.  Exitcare handout on skin infection given to patient.  RTC if worsening erythema, pain, purulent discharge, fever.  Wash towels, washcloths, sheets in hot  water with bleach every couple of days until infection resolved.  Wash with soap and water affected area at least daily if not BID.  Patient verbalized understanding, agreed with plan of care and had no further questions at this time.

## 2016-07-26 NOTE — Patient Instructions (Signed)
Wash with soap and water twice a day Do not scratch Apply triple antibiotic after washing area and cover with sock or bandage Monitor for signs of worsening infection e.g. Spreading rash, hot to touch, purulent discharge, worsening pain/swelling and follow up for re-evaluation if these occur   Cellulitis, Adult Cellulitis is a skin infection. The infected area is usually red and tender. This condition occurs most often in the arms and lower legs. The infection can travel to the muscles, blood, and underlying tissue and become serious. It is very important to get treated for this condition. What are the causes? Cellulitis is caused by bacteria. The bacteria enter through a break in the skin, such as a cut, burn, insect bite, open sore, or crack. What increases the risk? This condition is more likely to occur in people who:  Have a weak defense system (immune system).  Have open wounds on the skin such as cuts, burns, bites, and scrapes. Bacteria can enter the body through these open wounds.  Are older.  Have diabetes.  Have a type of long-lasting (chronic) liver disease (cirrhosis) or kidney disease.  Use IV drugs. What are the signs or symptoms? Symptoms of this condition include:  Redness, streaking, or spotting on the skin.  Swollen area of the skin.  Tenderness or pain when an area of the skin is touched.  Warm skin.  Fever.  Chills.  Blisters. How is this diagnosed? This condition is diagnosed based on a medical history and physical exam. You may also have tests, including:  Blood tests.  Lab tests.  Imaging tests. How is this treated? Treatment for this condition may include:  Medicines, such as antibiotic medicines or antihistamines.  Supportive care, such as rest and application of cold or warm cloths (cold or warm compresses) to the skin.  Hospital care, if the condition is severe. The infection usually gets better within 1-2 days of treatment. Follow  these instructions at home:  Take over-the-counter and prescription medicines only as told by your health care provider.  If you were prescribed an antibiotic medicine, take it as told by your health care provider. Do not stop taking the antibiotic even if you start to feel better.  Drink enough fluid to keep your urine clear or pale yellow.  Do not touch or rub the infected area.  Raise (elevate) the infected area above the level of your heart while you are sitting or lying down.  Apply warm or cold compresses to the affected area as told by your health care provider.  Keep all follow-up visits as told by your health care provider. This is important. These visits let your health care provider make sure a more serious infection is not developing. Contact a health care provider if:  You have a fever.  Your symptoms do not improve within 1-2 days of starting treatment.  Your bone or joint underneath the infected area becomes painful after the skin has healed.  Your infection returns in the same area or another area.  You notice a swollen bump in the infected area.  You develop new symptoms.  You have a general ill feeling (malaise) with muscle aches and pains. Get help right away if:  Your symptoms get worse.  You feel very sleepy.  You develop vomiting or diarrhea that persists.  You notice red streaks coming from the infected area.  Your red area gets larger or turns dark in color. This information is not intended to replace advice given to you  by your health care provider. Make sure you discuss any questions you have with your health care provider. Document Released: 12/06/2004 Document Revised: 07/07/2015 Document Reviewed: 01/05/2015 [employer] Interactive Patient Education  2017 [employer]

## 2016-07-31 ENCOUNTER — Ambulatory Visit: Payer: Self-pay | Admitting: Registered Nurse

## 2016-07-31 VITALS — BP 154/102 | HR 79 | Temp 98.0°F

## 2016-07-31 DIAGNOSIS — L03116 Cellulitis of left lower limb: Secondary | ICD-10-CM

## 2016-07-31 MED ORDER — CEPHALEXIN 500 MG PO CAPS: 500.0000 mg | ORAL_CAPSULE | Freq: Four times a day (QID) | ORAL | 0 refills | Status: AC

## 2016-07-31 NOTE — Patient Instructions (Addendum)
Start keflex 500mg  by mouth every 6 hours x 5 days Follow up Thursday for re-evaluation Do not scratch, may cover with telfa pad Hold all topical creams/ointments Wash with soapy water twice a day and wash sheets/blankets/towels more frequently until infection healed Be seen at another provider sooner if red streaks expand to knee, fever, chills  Cellulitis, Adult Cellulitis is a skin infection. The infected area is usually red and tender. This condition occurs most often in the arms and lower legs. The infection can travel to the muscles, blood, and underlying tissue and become serious. It is very important to get treated for this condition. What are the causes? Cellulitis is caused by bacteria. The bacteria enter through a break in the skin, such as a cut, burn, insect bite, open sore, or crack. What increases the risk? This condition is more likely to occur in people who:  Have a weak defense system (immune system).  Have open wounds on the skin such as cuts, burns, bites, and scrapes. Bacteria can enter the body through these open wounds.  Are older.  Have diabetes.  Have a type of long-lasting (chronic) liver disease (cirrhosis) or kidney disease.  Use IV drugs. What are the signs or symptoms? Symptoms of this condition include:  Redness, streaking, or spotting on the skin.  Swollen area of the skin.  Tenderness or pain when an area of the skin is touched.  Warm skin.  Fever.  Chills.  Blisters. How is this diagnosed? This condition is diagnosed based on a medical history and physical exam. You may also have tests, including:  Blood tests.  Lab tests.  Imaging tests. How is this treated? Treatment for this condition may include:  Medicines, such as antibiotic medicines or antihistamines.  Supportive care, such as rest and application of cold or warm cloths (cold or warm compresses) to the skin.  Hospital care, if the condition is severe. The infection  usually gets better within 1-2 days of treatment. Follow these instructions at home:  Take over-the-counter and prescription medicines only as told by your health care provider.  If you were prescribed an antibiotic medicine, take it as told by your health care provider. Do not stop taking the antibiotic even if you start to feel better.  Drink enough fluid to keep your urine clear or pale yellow.  Do not touch or rub the infected area.  Raise (elevate) the infected area above the level of your heart while you are sitting or lying down.  Apply warm or cold compresses to the affected area as told by your health care provider.  Keep all follow-up visits as told by your health care provider. This is important. These visits let your health care provider make sure a more serious infection is not developing. Contact a health care provider if:  You have a fever.  Your symptoms do not improve within 1-2 days of starting treatment.  Your bone or joint underneath the infected area becomes painful after the skin has healed.  Your infection returns in the same area or another area.  You notice a swollen bump in the infected area.  You develop new symptoms.  You have a general ill feeling (malaise) with muscle aches and pains. Get help right away if:  Your symptoms get worse.  You feel very sleepy.  You develop vomiting or diarrhea that persists.  You notice red streaks coming from the infected area.  Your red area gets larger or turns dark in color. This information  is not intended to replace advice given to you by your health care provider. Make sure you discuss any questions you have with your health care provider. Document Released: 12/06/2004 Document Revised: 07/07/2015 Document Reviewed: 01/05/2015 Elsevier Interactive Patient Education  2017 [employer].

## 2016-07-31 NOTE — Progress Notes (Signed)
Subjective:    Patient ID: Carol AlaminDeborah Murphy, female    DOB: 12-04-1950, 66 y.o.   MRN: [id number]  66 y/o caucasian female with small rash to L ankle. Applied triple abx cream OTC this weekend and it became blistered and draining. Covered with telfa and paper tape currently.  Patient did not want to start oral antibiotics last week as they tend to cause her GI upset.  Patient has been washing area with soapy water daily.  Sock was irritating skin also and that is why she covered with bandage.  "crusty yellow discharge" "red"      Review of Systems  Constitutional: Negative for activity change, appetite change, chills, diaphoresis, fatigue and fever.  HENT: Negative for congestion, ear pain and sore throat.   Eyes: Negative for pain and discharge.  Respiratory: Negative for cough and wheezing.   Cardiovascular: Negative for chest pain and leg swelling.  Gastrointestinal: Negative for blood in stool, constipation, diarrhea, nausea and vomiting.  Genitourinary: Negative for difficulty urinating, dysuria and hematuria.  Musculoskeletal: Negative for arthralgias, back pain, gait problem, joint swelling, myalgias, neck pain and neck stiffness.  Skin: Positive for color change and rash. Negative for pallor and wound.  Allergic/Immunologic: Negative for environmental allergies and food allergies.  Neurological: Negative for headaches.  Hematological: Negative for adenopathy. Does not bruise/bleed easily.  Psychiatric/Behavioral: Negative for agitation, confusion and sleep disturbance. The patient is not nervous/anxious.        Objective:   Physical Exam  Constitutional: She is oriented to person, place, and time. She appears well-developed and well-nourished.  Non-toxic appearance. She does not have a sickly appearance. She does not appear ill. No distress.  HENT:  Head: Normocephalic and atraumatic.  Right Ear: Hearing and external ear normal.  Left Ear: Hearing and external ear normal.   Nose: Nose normal.  Mouth/Throat: Uvula is midline, oropharynx is clear and moist and mucous membranes are normal. No oropharyngeal exudate, posterior oropharyngeal edema or posterior oropharyngeal erythema.  Eyes: Conjunctivae, EOM and lids are normal. Pupils are equal, round, and reactive to light. Right eye exhibits no discharge. Left eye exhibits no discharge. No scleral icterus.  Neck: Trachea normal and phonation normal. Neck supple. No neck rigidity. No tracheal deviation, no edema, no erythema and normal range of motion present.  Cardiovascular: Normal rate, regular rhythm, normal heart sounds and intact distal pulses.   Pulses:      Dorsalis pedis pulses are 2+ on the right side, and 2+ on the left side.  Pulmonary/Chest: Effort normal and breath sounds normal. No stridor. No respiratory distress. She has no wheezes. She has no rales.  Abdominal: Soft.  Musculoskeletal: She exhibits edema and tenderness. She exhibits no deformity.       Right knee: Normal.       Left knee: Normal.       Right ankle: Normal.       Left ankle: She exhibits swelling. She exhibits normal range of motion, no ecchymosis, no deformity, no laceration and normal pulse. Tenderness. Achilles tendon normal.       Right lower leg: Normal.       Left lower leg: Normal.       Feet:  Lymphadenopathy:    She has no cervical adenopathy.  Neurological: She is alert and oriented to person, place, and time. She is not disoriented. She displays no atrophy and no tremor. No cranial nerve deficit or sensory deficit. She exhibits normal muscle tone. She displays no seizure activity.  Coordination and gait normal. GCS eye subscore is 4. GCS verbal subscore is 5. GCS motor subscore is 6.  Skin: Skin is warm, dry and intact. Rash noted. No abrasion, no bruising, no burn, no ecchymosis, no laceration, no lesion, no petechiae and no purpura noted. Rash is macular, papular and maculopapular. Rash is not nodular, not pustular, not  vesicular and not urticarial. She is not diaphoretic. There is erythema. No cyanosis. No pallor. Nails show no clubbing.  3x5 cm grouped macular papular rash on erythematous base medial left ankle superior to malleolus; dressing without discharge when removed gauze secured by paper tape and sock rolled over top; edema nonpitting localized 0-1+/4 warm to touch dry  Psychiatric: She has a normal mood and affect. Her speech is normal and behavior is normal. Judgment and thought content normal. Cognition and memory are normal.  Nursing note and vitals reviewed.         Assessment & Plan:  A-cellulitis leg  P- Start keflex 500mg  po QID x 5 days #30 RF0 dispensed from [facility].  Patient has bactrim DS at home but holding at this time due to only able to tolerate for 24 hours last attempt to treat UTI in past year due to GI intolerance.  Stop OTC triple antibiotic and hold mupirocin do not pick up.  Patient given telfa and cobain from clinic stock to use and keep area covered when she has to wear socks and pants at work prevent further rubbing/contamination.  Air out when at home and wearing shorts/skirt..  Discussed with patient pants rubbing on irritated area when she folded down socks probable cause if not itching area.  Exitcare handout on skin infection given to patient.  RTC if worsening erythema, pain, purulent discharge, fever.  Wash towels, washcloths, sheets in hot water with bleach every couple of days until infection resolved.  Wash with soap and water affected area at least daily if not BID.  follow up Thursday clinic for re-evaluation. Patient verbalized understanding, agreed with plan of care and had no further questions at this time.

## 2016-08-02 ENCOUNTER — Ambulatory Visit: Payer: Self-pay | Admitting: Registered Nurse

## 2016-08-02 DIAGNOSIS — L03116 Cellulitis of left lower limb: Secondary | ICD-10-CM

## 2016-08-02 NOTE — Patient Instructions (Signed)
Continue keflex 500mg  by mouth three times a day for 6 days Monitor for worsening symptoms e.g. Swelling, drainage, fever, worsening rash and follow up for re-evaluation if this occurs   Cellulitis, Adult Cellulitis is a skin infection. The infected area is usually red and tender. This condition occurs most often in the arms and lower legs. The infection can travel to the muscles, blood, and underlying tissue and become serious. It is very important to get treated for this condition. What are the causes? Cellulitis is caused by bacteria. The bacteria enter through a break in the skin, such as a cut, burn, insect bite, open sore, or crack. What increases the risk? This condition is more likely to occur in people who:  Have a weak defense system (immune system).  Have open wounds on the skin such as cuts, burns, bites, and scrapes. Bacteria can enter the body through these open wounds.  Are older.  Have diabetes.  Have a type of long-lasting (chronic) liver disease (cirrhosis) or kidney disease.  Use IV drugs. What are the signs or symptoms? Symptoms of this condition include:  Redness, streaking, or spotting on the skin.  Swollen area of the skin.  Tenderness or pain when an area of the skin is touched.  Warm skin.  Fever.  Chills.  Blisters. How is this diagnosed? This condition is diagnosed based on a medical history and physical exam. You may also have tests, including:  Blood tests.  Lab tests.  Imaging tests. How is this treated? Treatment for this condition may include:  Medicines, such as antibiotic medicines or antihistamines.  Supportive care, such as rest and application of cold or warm cloths (cold or warm compresses) to the skin.  Hospital care, if the condition is severe. The infection usually gets better within 1-2 days of treatment. Follow these instructions at home:  Take over-the-counter and prescription medicines only as told by your health  care provider.  If you were prescribed an antibiotic medicine, take it as told by your health care provider. Do not stop taking the antibiotic even if you start to feel better.  Drink enough fluid to keep your urine clear or pale yellow.  Do not touch or rub the infected area.  Raise (elevate) the infected area above the level of your heart while you are sitting or lying down.  Apply warm or cold compresses to the affected area as told by your health care provider.  Keep all follow-up visits as told by your health care provider. This is important. These visits let your health care provider make sure a more serious infection is not developing. Contact a health care provider if:  You have a fever.  Your symptoms do not improve within 1-2 days of starting treatment.  Your bone or joint underneath the infected area becomes painful after the skin has healed.  Your infection returns in the same area or another area.  You notice a swollen bump in the infected area.  You develop new symptoms.  You have a general ill feeling (malaise) with muscle aches and pains. Get help right away if:  Your symptoms get worse.  You feel very sleepy.  You develop vomiting or diarrhea that persists.  You notice red streaks coming from the infected area.  Your red area gets larger or turns dark in color. This information is not intended to replace advice given to you by your health care provider. Make sure you discuss any questions you have with your health care  provider. Document Released: 12/06/2004 Document Revised: 07/07/2015 Document Reviewed: 01/05/2015 Elsevier Interactive Patient Education  2017 [employer]

## 2016-08-02 NOTE — Progress Notes (Signed)
66 y/o caucasian female  Follow up for rash to L ankle. Applied triple abx cream OTC this weekend and it became blistered and draining. Started keflex Tuesday took every 8 hours as "I can't stay up to take it every 6 hours past my bedtime"  Wearing telfa bandaids.  "It is feeling better today and looks better per patient.  Keflex not causing side effects/GI upset.  Patient has been washing area with soapy water daily.  Sock was irritating skin also and that is why she covered with bandage. Leaving open to air at home and wearing shorts/flip flops so clothes do not rub area.     Review of Systems  Constitutional: Negative for activity change, appetite change, chills, diaphoresis, fatigue and fever.  HENT: Negative for congestion, ear pain and sore throat.   Eyes: Negative for pain and discharge.  Respiratory: Negative for cough and wheezing.   Cardiovascular: Negative for chest pain and leg swelling.  Gastrointestinal: Negative for blood in stool, constipation, diarrhea, nausea and vomiting.  Genitourinary: Negative for difficulty urinating, dysuria and hematuria.  Musculoskeletal: Negative for arthralgias, back pain, gait problem, joint swelling, myalgias, neck pain and neck stiffness.  Skin: Positive for color change and rash. Negative for pallor and wound.  Allergic/Immunologic: Negative for environmental allergies and food allergies.  Neurological: Negative for headaches.  Hematological: Negative for adenopathy. Does not bruise/bleed easily.  Psychiatric/Behavioral: Negative for agitation, confusion and sleep disturbance. The patient is not nervous/anxious.        Objective:   Physical Exam  Constitutional: She is oriented to person, place, and time. She appears well-developed and well-nourished.  Non-toxic appearance. She does not have a sickly appearance. She does not appear ill. No distress.  HENT:  Head: Normocephalic and atraumatic.  Right Ear: Hearing and external ear  normal.  Left Ear: Hearing and external ear normal.  Nose: Nose normal.  Mouth/Throat: Uvula is midline, oropharynx is clear and moist and mucous membranes are normal. No oropharyngeal exudate, posterior oropharyngeal edema or posterior oropharyngeal erythema.  Eyes: Conjunctivae, EOM and lids are normal. Pupils are equal, round, and reactive to light. Right eye exhibits no discharge. Left eye exhibits no discharge. No scleral icterus.  Neck: Trachea normal and phonation normal. Neck supple. No neck rigidity. No tracheal deviation, no edema, no erythema and normal range of motion present.  Cardiovascular: Normal rate, regular rhythm, normal heart sounds and intact distal pulses.   Pulses:      Dorsalis pedis pulses are 2+ on the right side, and 2+ on the left side.  Pulmonary/Chest: Effort normal and breath sounds normal. No stridor. No respiratory distress. She has no wheezes. She has no rales.  Abdominal: Soft.  Musculoskeletal: She exhibits no deformity, edema or tenderness.       Right knee: Normal.       Left knee: Normal.       Right ankle: Normal.       Left ankle: She exhibits normal range of motion, no ecchymosis, no deformity, no laceration, no tenderness, no edema and normal pulse. Achilles tendon normal.       Right lower leg: Normal.       Left lower leg: Normal.       Feet:  Lymphadenopathy:    She has no cervical adenopathy.  Neurological: She is alert and oriented to person, place, and time. She is not disoriented. She displays no atrophy and no tremor. No cranial nerve deficit or sensory deficit. She exhibits normal muscle tone.  She displays no seizure activity. Coordination and gait normal. GCS eye subscore is 4. GCS verbal subscore is 5. GCS motor subscore is 6.  Skin: Skin is warm, dry and intact. Rash noted. No abrasion, no bruising, no burn, no ecchymosis, no laceration, no lesion, no petechiae and no purpura noted. Rash is macular, papular and maculopapular. Rash is not  nodular, not pustular, not vesicular and not urticarial. She is not diaphoretic. There is erythema. No cyanosis. No pallor. Nails show no clubbing.  3x5 cm grouped dry macular papular rash on erythematous base medial left ankle superior to malleolus slightly scaley today areas of dry skin/flaking; telfa dressing without discharge when removed; edema resolved not TTP  Psychiatric: She has a normal mood and affect. Her speech is normal and behavior is normal. Judgment and thought content normal. Cognition and memory are normal.  Nursing note and vitals reviewed.         Assessment & Plan:  A-cellulitis leg  P-Improving increased temperature resolved and no further drainage/weeping.  Has not had any GI side effects with keflex.  Continue keflex 500mg  po TID x 6 days #30 RF0 dispensed from [facility]. Continue hold OTC triple antibiotic and mupirocin do not pick up.  May apply fragrance free vaseline daily.  Patient given telfa and cobain from clinic stock to use and keep area covered when she has to wear socks and pants at work prevent further rubbing/contamination.  Air out when at home and wearing shorts/skirt.. Discussed with patient pants rubbing on irritated area when she folded down socks probable cause if not itching area. Exitcare handout on skin infection given to patient. RTC if worsening erythema, pain, purulent discharge, fever. Wash towels, washcloths, sheets in hot water with bleach every couple of days until infection resolved.Wash with soap and water affected area at least daily if not BID. follow up Thursday clinic for re-evaluation.Patient verbalized understanding, agreed with plan of care and had no further questions at this time.

## 2016-08-16 ENCOUNTER — Ambulatory Visit: Payer: Self-pay | Admitting: Registered Nurse

## 2016-08-16 VITALS — BP 125/87 | HR 64 | Temp 97.5°F

## 2016-08-16 DIAGNOSIS — E538 Deficiency of other specified B group vitamins: Secondary | ICD-10-CM

## 2016-08-16 DIAGNOSIS — L03116 Cellulitis of left lower limb: Secondary | ICD-10-CM

## 2016-08-16 DIAGNOSIS — L03115 Cellulitis of right lower limb: Secondary | ICD-10-CM

## 2016-08-16 MED ORDER — CEPHALEXIN 500 MG PO CAPS: 500.0000 mg | ORAL_CAPSULE | Freq: Three times a day (TID) | ORAL | 0 refills | Status: AC

## 2016-08-16 MED ORDER — CYANOCOBALAMIN 1000 MCG/ML IJ SOLN
1000.0000 ug | Freq: Once | INTRAMUSCULAR | Status: AC
Start: 2016-08-16 — End: 2016-08-16
  Administered 2016-08-16: 1000 ug via INTRAMUSCULAR

## 2016-08-16 NOTE — Patient Instructions (Signed)
Cyanocobalamin, Vitamin B12 injection What is this medicine? CYANOCOBALAMIN (sye an oh koe BAL a min) is a man made form of vitamin B12. Vitamin B12 is used in the growth of healthy blood cells, nerve cells, and proteins in the body. It also helps with the metabolism of fats and carbohydrates. This medicine is used to treat people who can not absorb vitamin B12. This medicine may be used for other purposes; ask your health care provider or pharmacist if you have questions. COMMON BRAND NAME(S): B-12 Compliance Kit, B-12 Injection Kit, Cyomin, LA-12, Nutri-Twelve, Physicians EZ Use B-12, Primabalt What should I tell my health care provider before I take this medicine? They need to know if you have any of these conditions: -kidney disease -Leber's disease -megaloblastic anemia -an unusual or allergic reaction to cyanocobalamin, cobalt, other medicines, foods, dyes, or preservatives -pregnant or trying to get pregnant -breast-feeding How should I use this medicine? This medicine is injected into a muscle or deeply under the skin. It is usually given by a health care professional in a clinic or doctor's office. However, your doctor may teach you how to inject yourself. Follow all instructions. Talk to your pediatrician regarding the use of this medicine in children. Special care may be needed. Overdosage: If you think you have taken too much of this medicine contact a poison control center or emergency room at once. NOTE: This medicine is only for you. Do not share this medicine with others. What if I miss a dose? If you are given your dose at a clinic or doctor's office, call to reschedule your appointment. If you give your own injections and you miss a dose, take it as soon as you can. If it is almost time for your next dose, take only that dose. Do not take double or extra doses. What may interact with this medicine? -colchicine -heavy alcohol intake This list may not describe all possible  interactions. Give your health care provider a list of all the medicines, herbs, non-prescription drugs, or dietary supplements you use. Also tell them if you smoke, drink alcohol, or use illegal drugs. Some items may interact with your medicine. What should I watch for while using this medicine? Visit your doctor or health care professional regularly. You may need blood work done while you are taking this medicine. You may need to follow a special diet. Talk to your doctor. Limit your alcohol intake and avoid smoking to get the best benefit. What side effects may I notice from receiving this medicine? Side effects that you should report to your doctor or health care professional as soon as possible: -allergic reactions like skin rash, itching or hives, swelling of the face, lips, or tongue -blue tint to skin -chest tightness, pain -difficulty breathing, wheezing -dizziness -red, swollen painful area on the leg Side effects that usually do not require medical attention (report to your doctor or health care professional if they continue or are bothersome): -diarrhea -headache This list may not describe all possible side effects. Call your doctor for medical advice about side effects. You may report side effects to FDA at 1-800-FDA-1088. Where should I keep my medicine? Keep out of the reach of children. Store at room temperature between 15 and 30 degrees C (59 and 85 degrees F). Protect from light. Throw away any unused medicine after the expiration date. NOTE: This sheet is a summary. It may not cover all possible information. If you have questions about this medicine, talk to your doctor, pharmacist, or   health care provider.  2018 Elsevier/Gold Standard (2007-06-09 22:10:20) Cellulitis, Adult Cellulitis is a skin infection. The infected area is usually red and tender. This condition occurs most often in the arms and lower legs. The infection can travel to the muscles, blood, and underlying  tissue and become serious. It is very important to get treated for this condition. What are the causes? Cellulitis is caused by bacteria. The bacteria enter through a break in the skin, such as a cut, burn, insect bite, open sore, or crack. What increases the risk? This condition is more likely to occur in people who:  Have a weak defense system (immune system).  Have open wounds on the skin such as cuts, burns, bites, and scrapes. Bacteria can enter the body through these open wounds.  Are older.  Have diabetes.  Have a type of long-lasting (chronic) liver disease (cirrhosis) or kidney disease.  Use IV drugs.  What are the signs or symptoms? Symptoms of this condition include:  Redness, streaking, or spotting on the skin.  Swollen area of the skin.  Tenderness or pain when an area of the skin is touched.  Warm skin.  Fever.  Chills.  Blisters.  How is this diagnosed? This condition is diagnosed based on a medical history and physical exam. You may also have tests, including:  Blood tests.  Lab tests.  Imaging tests.  How is this treated? Treatment for this condition may include:  Medicines, such as antibiotic medicines or antihistamines.  Supportive care, such as rest and application of cold or warm cloths (cold or warm compresses) to the skin.  Hospital care, if the condition is severe.  The infection usually gets better within 1-2 days of treatment. Follow these instructions at home:  Take over-the-counter and prescription medicines only as told by your health care provider.  If you were prescribed an antibiotic medicine, take it as told by your health care provider. Do not stop taking the antibiotic even if you start to feel better.  Drink enough fluid to keep your urine clear or pale yellow.  Do not touch or rub the infected area.  Raise (elevate) the infected area above the level of your heart while you are sitting or lying down.  Apply warm or  cold compresses to the affected area as told by your health care provider.  Keep all follow-up visits as told by your health care provider. This is important. These visits let your health care provider make sure a more serious infection is not developing. Contact a health care provider if:  You have a fever.  Your symptoms do not improve within 1-2 days of starting treatment.  Your bone or joint underneath the infected area becomes painful after the skin has healed.  Your infection returns in the same area or another area.  You notice a swollen bump in the infected area.  You develop new symptoms.  You have a general ill feeling (malaise) with muscle aches and pains. Get help right away if:  Your symptoms get worse.  You feel very sleepy.  You develop vomiting or diarrhea that persists.  You notice red streaks coming from the infected area.  Your red area gets larger or turns dark in color. This information is not intended to replace advice given to you by your health care provider. Make sure you discuss any questions you have with your health care provider. Document Released: 12/06/2004 Document Revised: 07/07/2015 Document Reviewed: 01/05/2015 Elsevier Interactive Patient Education  2017 [employer].

## 2016-08-16 NOTE — Progress Notes (Signed)
Subjective:    Patient ID: Carol Murphy, female    DOB: [birth year], 66 y.o.   MRN: [id number]  HPI  66 y/o caucasian female  Follow up for rash to L ankle and new rash right shin.  Has been applying vaseline to irritated areas and covering with dressing ankle area at work.  Took all keflex as directed but rash reoccured and now on other leg also.  Patient denied scratching, bug bites, trauma.  Scheduled appt with dermatology.  Hasn't been working in yard now that she is in an apt-denied exposure to poison oak/ivy.  Patient has been washing area with soapy water daily. Sock was irritating skin also and that is why she covered with bandage. Leaving open to air at home and wearing shorts/flip flops so clothes do not rub area.  Left ankle a little swollen.  Review of Systems Constitutional: Negative for activity change, appetite change, chills, diaphoresis, fatigueand fever.  HENT: Negative for congestion, ear painand sore throat.  Eyes: Negative for painand discharge.  Respiratory: Negative for coughand wheezing.  Cardiovascular: Negative for chest pain.  Positive leg swelling.  Gastrointestinal: Negative for blood in stool, constipation, diarrhea, nauseaand vomiting.  Genitourinary: Negative for difficulty urinating, dysuriaand hematuria.  Musculoskeletal: Negative for arthralgias, back pain, gait problem, joint swelling, myalgias, neck painand neck stiffness.  Skin: Positive for color changeand rash. Negative for pallorand wound.  Allergic/Immunologic: Negative for environmental allergiesand food allergies.  Neurological: Negative for headaches.  Hematological: Negative for adenopathy. Does not bruise/bleed easily.  Psychiatric/Behavioral: Negative for agitation, confusionand sleep disturbance. The patient is not nervous/anxious.      Objective:   Physical Exam  Musculoskeletal:       Legs:  Constitutional: She is oriented to person, place, and time. She appears  well-developedand well-nourished. Non-toxic appearance. She does not have a sickly appearance. She does not appear ill. No distress.  HENT:  Head: Normocephalicand atraumatic.  Right Ear: Hearingand external earnormal.  Left Ear: Hearingand external earnormal.  Nose: Nose normal.  Mouth/Throat: Uvula is midline, oropharynx is clear and moistand mucous membranes are normal. No oropharyngeal exudate, posterior oropharyngeal edemaor posterior oropharyngeal erythema.  Eyes: Conjunctivae, EOMand lidsare normal. Pupils are equal, round, and reactive to light. Right eye exhibits no discharge. Left eye exhibits no discharge. No scleral icterus.  Neck: Trachea normaland phonation normal. Neck supple. No neck rigidity. No tracheal deviation, no edema, no erythemaand normal range of motionpresent.  Cardiovascular: Normal rate, regular rhythm, normal heart soundsand intact distal pulses.  Pulses: Dorsalis pedis pulses are 2+on the right side, and 2+on the left side.  Pulmonary/Chest: Effort normaland breath sounds normal. No stridor. No respiratory distress. She has no wheezes. She has no rales.  Abdominal: Soft.  Musculoskeletal: She exhibits no deformity or tenderness.  Right knee: Normal.  Left knee: Normal.  Right ankle: Normal.  Left ankle: She exhibits normal range of motion, no ecchymosis, no deformity, no laceration, no tenderness, no edemaand normal pulse. Achilles tendon normal.   Feet:  Lymphadenopathy:  She has no cervical adenopathy.  Neurological: She is alertand oriented to person, place, and time. She is not disoriented. She displays no atrophyand no tremor. No cranial nerve deficitor sensory deficit. She exhibits normal muscle tone. She displays no seizure activity. Coordinationand gaitnormal. GCS eye subscore is 4. GCS verbal subscore is 5. GCS motor subscore is 6.  Skin: Skin is warm, dryand intact. Rashnoted. No abrasion, no  bruising, no burn, no ecchymosis, no laceration, no lesion, no petechiaeand no  purpuranoted. Rash is macular, papularand maculopapular. Rash is not nodular, not pustular, not vesicularand not urticarial. She is not diaphoretic. There is erythema. No cyanosis. No pallor. Nails show no clubbing.  3x5 cm grouped dry macular papular rash on erythematous base medial left ankle superior to malleolus with a few vesicles of clear fluid; telfa dressing without discharge when removed; edema nonpitting 0-1+/4 not TTP + erythema; right shin distal to patella 1cm erythematous area macular with 2 small pinpoint abrasions/brown scab noted. Psychiatric: She has a normal mood and affect. Her speech is normaland behavior is normal. Judgmentand thought contentnormal. Cognition and memoryare normal.  Nursing noteand vitalsreviewed.       Assessment & Plan:  A-cellulitis legs bilaterally  P- Reoccurence right shin and left medial ankle.  Refilled keflex 500mg  po TID x 7 days #30 RF0 dispensed from [facility]. Continue hold OTC triple antibiotic and mupirocin do not pick up.  May apply fragrance free vaseline daily. Patient given telfa and cobain from clinic stock to use and keep area covered when she has to wear socks and pants at work prevent further rubbing/contamination. Air out when at home and wearing shorts/skirt.. Discussed with patient pants rubbing on irritated area when she folded down socks probable cause if not itching area. Exitcare handout on skin infection given to patient. RTC if worsening erythema, pain, purulent discharge, fever. Wash towels, washcloths, sheets in hot water with bleach every couple of days until infection resolved. Avoid scratching affected areas.  If worsening rash/erythema/fever/purulent discharge follow up for re-evaluation.  Wash with soap and water affected area at least daily if not BID.follow up Thursday clinic for re-evaluation.Patient verbalized understanding,  agreed with plan of care and had no further questions at this time.

## 2016-12-14 ENCOUNTER — Encounter: Payer: Self-pay | Admitting: Medical

## 2016-12-14 ENCOUNTER — Ambulatory Visit (INDEPENDENT_AMBULATORY_CARE_PROVIDER_SITE_OTHER): Payer: Medicare Other | Admitting: Medical

## 2016-12-14 VITALS — BP 128/80 | HR 94 | Temp 99.2°F | Wt 161.2 lb

## 2016-12-14 DIAGNOSIS — Z23 Encounter for immunization: Secondary | ICD-10-CM

## 2016-12-14 DIAGNOSIS — R350 Frequency of micturition: Secondary | ICD-10-CM | POA: Diagnosis not present

## 2016-12-14 LAB — POCT URINALYSIS DIP (PROADVANTAGE DEVICE)
Bilirubin, UA: NEGATIVE
Glucose, UA: NEGATIVE mg/dL
Ketones, POC UA: NEGATIVE mg/dL
NITRITE UA: NEGATIVE
PROTEIN UA: NEGATIVE mg/dL
Specific Gravity, Urine: 1.015
UUROB: NEGATIVE
pH, UA: 6 (ref 5.0–8.0)

## 2016-12-14 NOTE — Progress Notes (Signed)
Subjective:  Carol Murphy is a 66 y.o. female who complains of possible urinary tract infection.  She has had symptoms for 2 days.  Symptoms include urinary frequency, burning at end, some pink urine and minute flecks of blood in toilet.   Has urgency.   No urine odor.  No fever, no belly or back pain.  No NVD.  No vaginal symptoms.  Using left over bactrim from prior visit at work for current symptoms.  Patient does not have a history of recurrent UTI. Patient does not have a history of pyelonephritis.  No other aggravating or relieving factors.  No other c/o.  Past Medical History:  Diagnosis Date  . Allergic rhinitis, cause unspecified   . Anemia    low-normal B12, resolved on B12  . Diverticulosis   . DJD (degenerative joint disease)    low back, hips, knees, arthritis in thumbs  . Endometriosis    s/p TAH-BSO age 71  . Former smoker    quit 2006  . H/O bone density study 04/04/12, 2009   low bone mass, improved from 2009 study, consider repeat 3 years  . H/O echocardiogram 04/24/2006   normal study, EF>55%, mild tricuspid regurgitation; Dr. Jacinto Halim  . H/O mammogram 04/04/12   normal  . History of cardiovascular stress test 07/19/04   age 39yo, normal cardiolite study, Dr. Jacinto Halim  . IBS (irritable bowel syndrome)   . Osteopenia    DEXA '09 T-1.7 at hip ([facility])  . Pure hypercholesterolemia   . Unspecified vitamin D deficiency   . Wears glasses    Current Outpatient Prescriptions on File Prior to Visit  Medication Sig Dispense Refill  . buPROPion (WELLBUTRIN XL) 150 MG 24 hr tablet Take 1 tablet (150 mg total) by mouth daily. 90 tablet 3  . butalbital-acetaminophen-caffeine (FIORICET, ESGIC) 50-325-40 MG tablet TAKE 1 TO 2 TABLETS BY MOUTH EVERY 4 HOURS AS NEEDED FOR HEADACHE 14 tablet 0  . Calcium-Vitamin D 600-200 MG-UNIT per tablet Take 1 tablet by mouth daily. A     . cyanocobalamin (,VITAMIN B-12,) 1000 MCG/ML injection INJECT 1 ML (1,000 MCG TOTAL) INTO THE MUSCLE EVERY 21 (  TWENTY-ONE) DAYS. 30 mL 4  . diclofenac sodium (VOLTAREN) 1 % GEL Apply 2 g topically 4 (four) times daily. 100 g 2  . diphenhydramine-acetaminophen (TYLENOL PM) 25-500 MG TABS Take 1 tablet by mouth at bedtime as needed. Reported on 05/06/2015    . fluticasone (FLONASE) 50 MCG/ACT nasal spray Place 1 spray into both nostrils 2 (two) times daily.    Marland Kitchen ibuprofen (ADVIL,MOTRIN) 800 MG tablet Take 1 tablet (800 mg total) by mouth every 8 (eight) hours as needed. 90 tablet 1  . omeprazole (PRILOSEC) 20 MG capsule Take 1 capsule (20 mg total) by mouth daily. 90 capsule 0  . simvastatin (ZOCOR) 40 MG tablet Take 1 tablet (40 mg total) by mouth daily. 90 tablet 3  . levocetirizine (XYZAL) 5 MG tablet Take 1 tablet (5 mg total) by mouth every evening. 90 tablet 0   No current facility-administered medications on file prior to visit.     ROS as in subjective  Reviewed allergies, medications, past medical, surgical, and social history.    Objective: BP 128/80   Pulse 94   Temp 99.2 F (37.3 C)   Wt 161 lb 3.2 oz (73.1 kg)   SpO2 94%   BMI 27.67 kg/m   General appearance: alert, no distress, WD/WN, female Abdomen: +bs, soft, non tender, non distended, no masses,  no hepatomegaly, no splenomegaly, no bruits Back: no CVA tenderness GU: deferred      Assessment: Encounter Diagnoses  Name Primary?  . Urine frequency Yes  . Need for influenza vaccination      Plan: Discussed symptoms, diagnosis, possible complications, and usual course of illness.  Begin medication Bactrim she has left over BID x 3- 5 days.  Advised increased water intake, can use OTC Tylenol for pain.    Urine culture sent.  Call or return if worse or not improving.    Counseled on the influenza virus vaccine.  Vaccine information sheet given.   High dose Influenza vaccine given after consent obtained.  Wished her well on her move to [facility]

## 2016-12-17 ENCOUNTER — Other Ambulatory Visit: Payer: Self-pay | Admitting: Medical

## 2016-12-17 LAB — URINE CULTURE
MICRO NUMBER:: 81110527
SPECIMEN QUALITY:: ADEQUATE

## 2016-12-17 MED ORDER — CIPROFLOXACIN HCL 500 MG PO TABS: 500.0000 mg | ORAL_TABLET | Freq: Two times a day (BID) | ORAL | 0 refills | Status: AC

## 2017-03-14 ENCOUNTER — Telehealth: Payer: Self-pay | Admitting: Medical

## 2017-03-14 NOTE — Telephone Encounter (Signed)
[place] (said very sad doesn't want to leave you) [facility] [address] [place] T# [phone]

## 2017-03-14 NOTE — Telephone Encounter (Signed)
Please call out a 30 day supply.   I with her well.

## 2017-03-15 ENCOUNTER — Other Ambulatory Visit: Payer: Self-pay

## 2017-03-15 NOTE — Telephone Encounter (Signed)
Called into pharmacy

## 2017-03-16 NOTE — Telephone Encounter (Signed)
done

## 2017-08-17 IMAGING — MR MR SHOULDER*L* W/O CM
4 of 5 series · 25 of 40 positions shown · non-contrast
Comparison: None.

CLINICAL DATA: 64-year-old with bilateral shoulder pain and
bilateral shoulder spurs, left greater than right. No acute injury
or prior relevant surgery. Initial encounter.

EXAM:
MRI OF THE LEFT SHOULDER WITHOUT CONTRAST
TECHNIQUE: Multiplanar, multisequence MR imaging of the shoulder was performed.
No intravenous contrast was administered.

[Series 3: T2 fat-sat · axial · 4.0mm · 0.55mm/px · z∈[-49,+27]mm · 8 of 18 slices shown (1 of 3)]
[im 1/18]
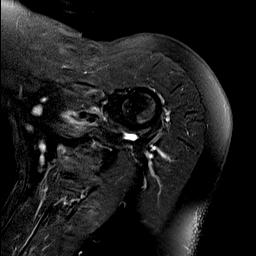
[im 3/18]
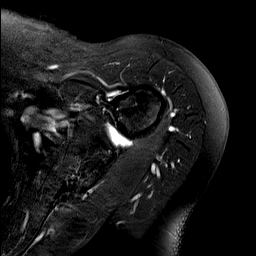
[im 5/18]
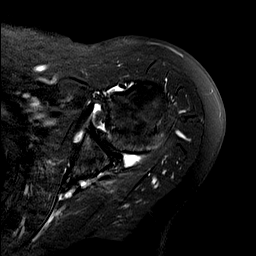
[im 8/18]
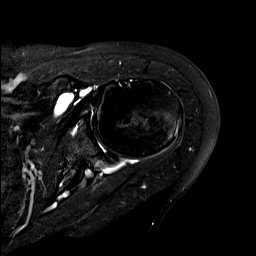
[im 10/18]
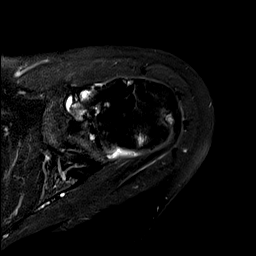
[im 13/18]
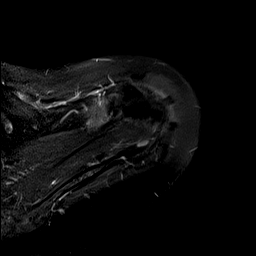
[im 15/18]
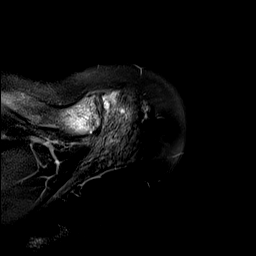
[im 18/18]
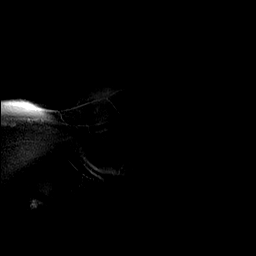

[Series 4: T2 fat-sat · oblique · 4.0mm · 0.55mm/px · 6 of 17 slices shown (2 of 3)]
[im 1/17]
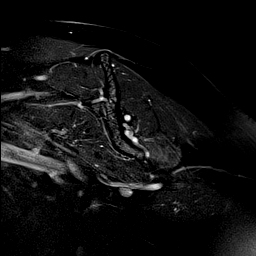
[im 3/17]
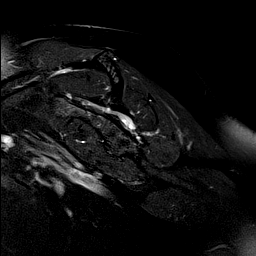
[im 5/17]
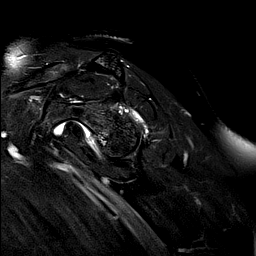
[im 7/17]
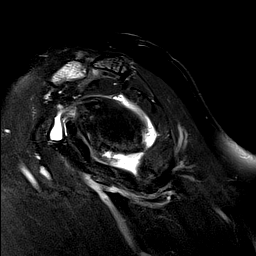
[im 10/17]
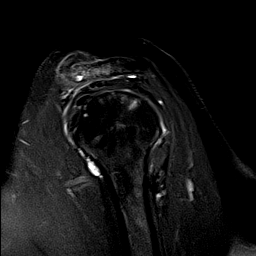
[im 14/17]
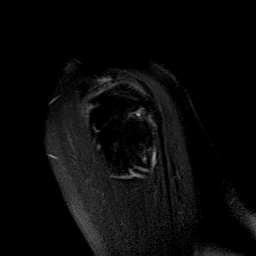

[Series 7: PD · oblique · 4.0mm · 0.22mm/px · 8 of 16 slices shown]
[im 1/16]
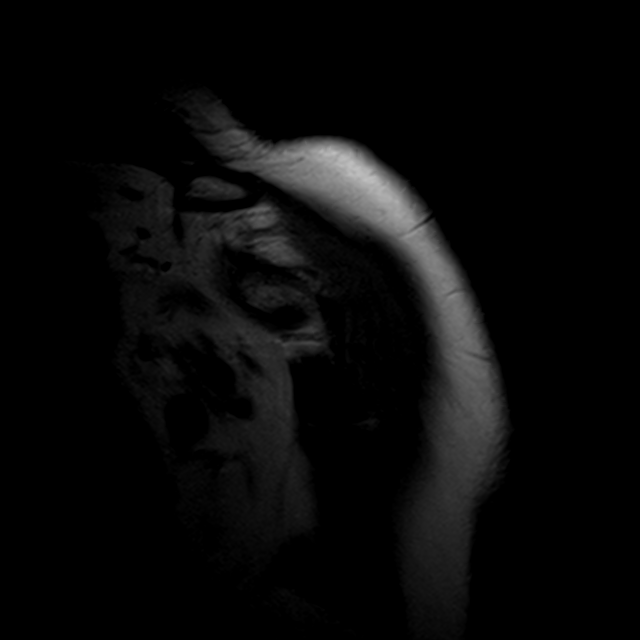
[im 3/16]
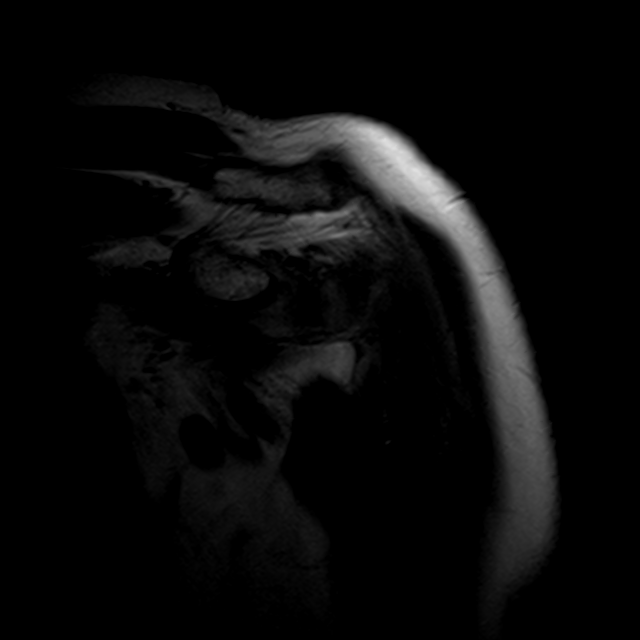
[im 5/16]
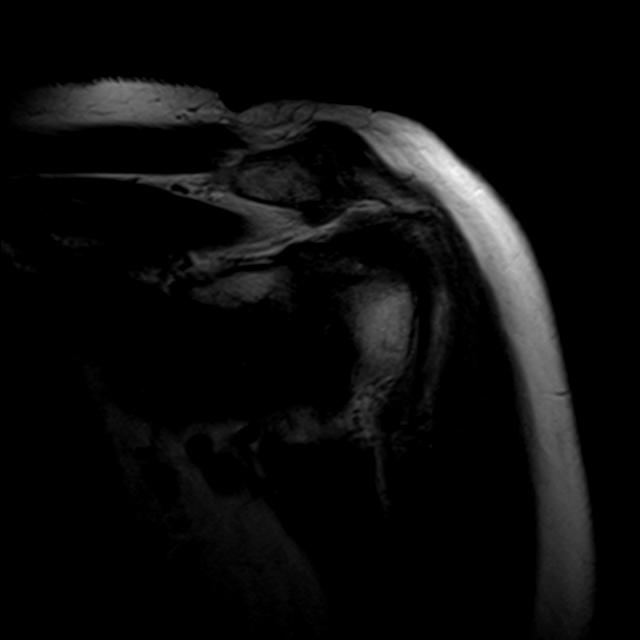
[im 7/16]
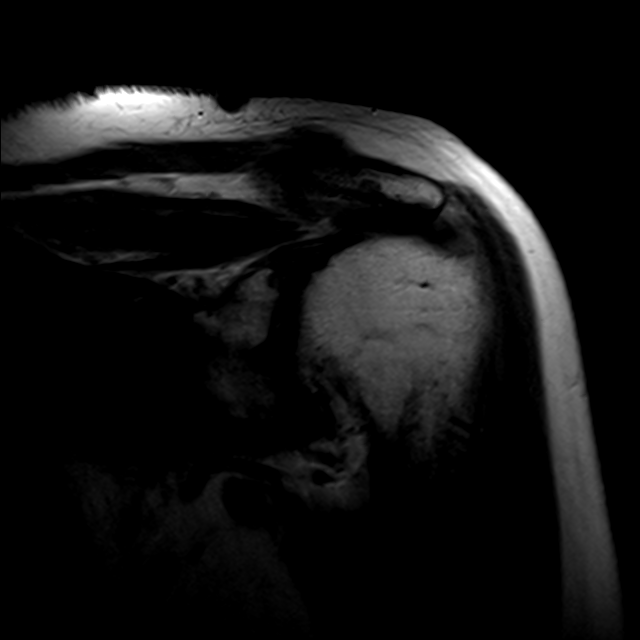
[im 9/16]
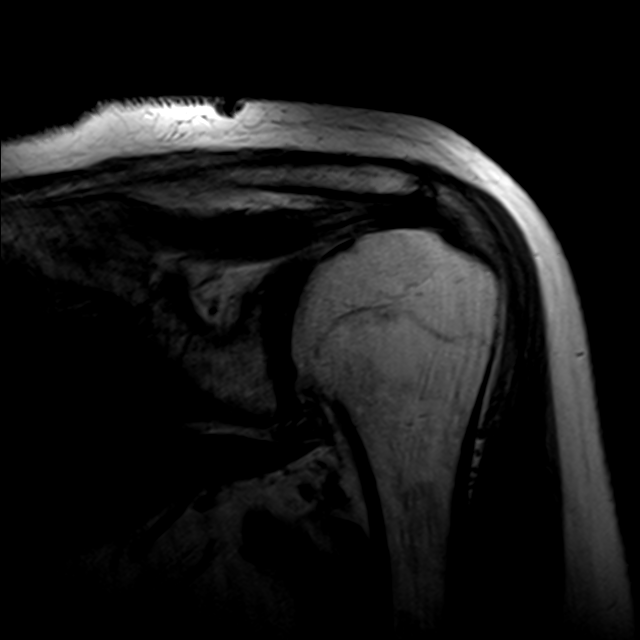
[im 11/16]
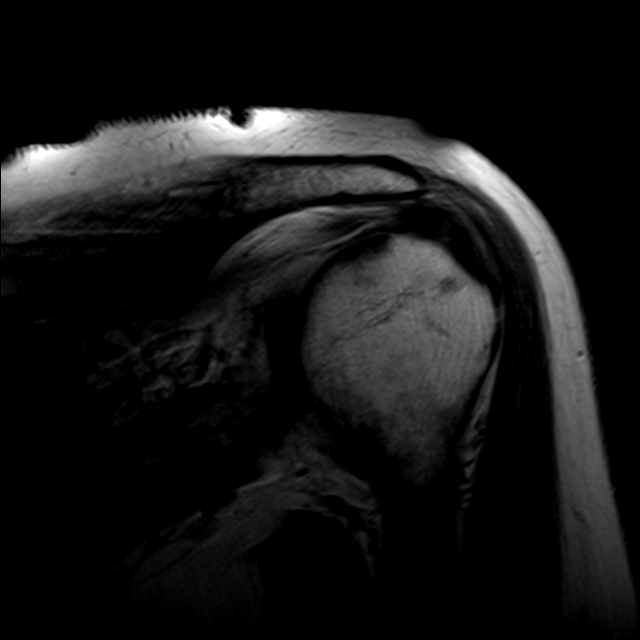
[im 13/16]
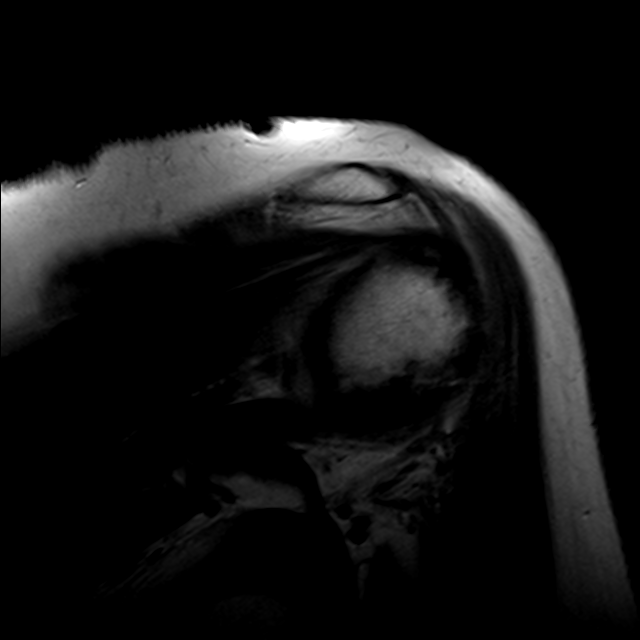
[im 16/16]
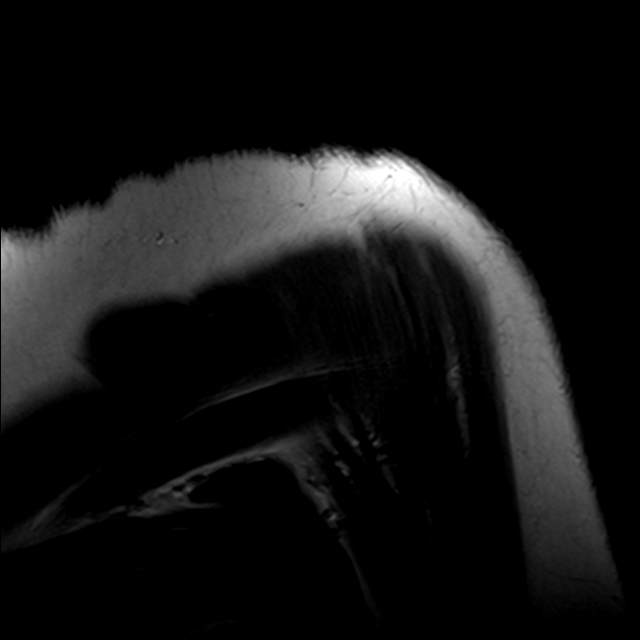

[Series 8: T2 fat-sat · oblique · 4.0mm · 0.27mm/px · 3 of 16 slices shown (3 of 3)]
[im 3/16]
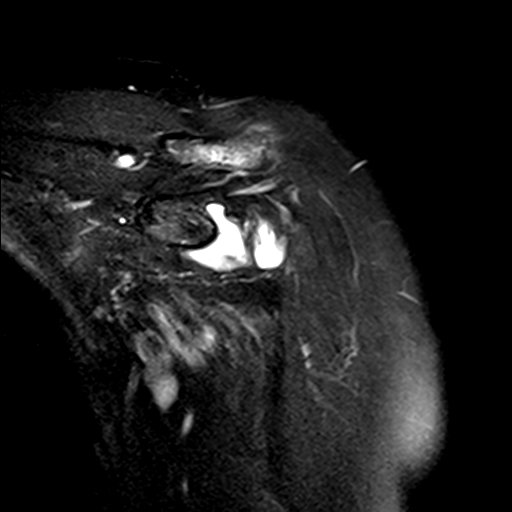
[im 9/16]
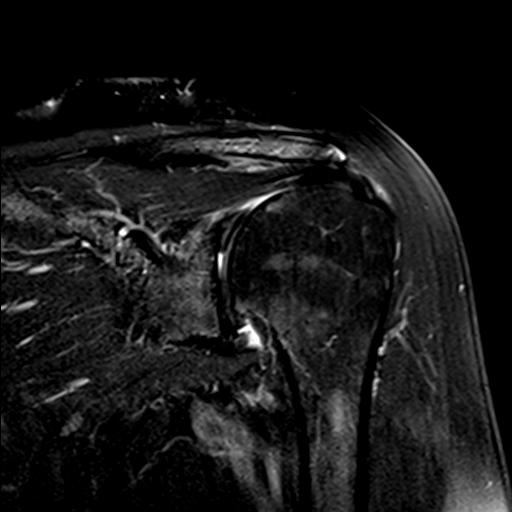
[im 13/16]
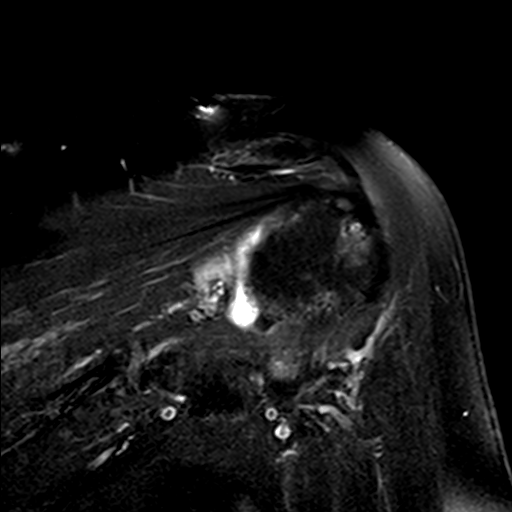

[25 of 40 positions shown; findings below may reference images not displayed]

FINDINGS: Rotator cuff: Mild supraspinatus and infraspinous tendinosis. There
appears to be partial bursal surface insertional tearing of the
supraspinatus tendon anteriorly on the sagittal images, although no
full-thickness tendon tear or tendon retraction identified. The
subscapularis and teres minor tendons demonstrate no significant
findings.

Muscles:  No focal muscular atrophy or edema.

Biceps long head: Intact and normally positioned. Mild tendinosis of
the intra-articular portion.

Acromioclavicular Joint: The acromion is type 2. There are advanced
acromioclavicular degenerative changes with osteophytes, subchondral
edema and cyst formation. There is minimal fluid in the subacromial
-subdeltoid bursa.

Glenohumeral Joint: Moderately advanced glenohumeral degenerative
changes with diffuse chondral thinning, osteophytes and subchondral
cyst formation. Only a small shoulder joint effusion is present. No
loose bodies observed.

Labrum: Diffuse degenerative tearing of the labrum, especially
posteriorly and superiorly.

Bones:  No significant extra-articular osseous findings.
IMPRESSION: 1. The primary abnormality is moderately advanced glenohumeral
osteoarthritis with associated diffuse degenerative labral tearing.
2. Relatively mild rotator cuff disease with probable bursal surface
partial tearing of the distal supraspinatus tendon anteriorly. No
full-thickness rotator cuff tear identified.
3. Advanced acromioclavicular degenerative changes may contribute to
superior shoulder pain and rotator cuff impingement.
